# Patient Record
Sex: Female | Born: 1950 | Race: White | Hispanic: No | Marital: Married | Smoking: Never smoker
Health system: Southern US, Community
[De-identification: ages and names within clinical notes are randomized; demographics above are authoritative.]

## PROBLEM LIST (undated history)

## (undated) DIAGNOSIS — C4490 Unspecified malignant neoplasm of skin, unspecified: Secondary | ICD-10-CM

## (undated) DIAGNOSIS — I10 Essential (primary) hypertension: Secondary | ICD-10-CM

## (undated) DIAGNOSIS — E785 Hyperlipidemia, unspecified: Secondary | ICD-10-CM

## (undated) HISTORY — PX: LYMPH NODE DISSECTION: SHX5087

## (undated) HISTORY — PX: SKIN CANCER EXCISION: SHX779

## (undated) HISTORY — DX: Essential (primary) hypertension: I10

---

## 2004-07-01 ENCOUNTER — Ambulatory Visit (HOSPITAL_COMMUNITY): Admission: RE | Admit: 2004-07-01 | Discharge: 2004-07-01 | Payer: Self-pay | Admitting: Gynecology

## 2005-12-22 ENCOUNTER — Ambulatory Visit: Payer: Self-pay | Admitting: Internal Medicine

## 2006-01-07 ENCOUNTER — Ambulatory Visit: Payer: Self-pay | Admitting: Gastroenterology

## 2007-01-19 ENCOUNTER — Ambulatory Visit: Payer: Self-pay | Admitting: Internal Medicine

## 2008-04-05 ENCOUNTER — Ambulatory Visit: Payer: Self-pay | Admitting: Internal Medicine

## 2008-04-16 ENCOUNTER — Ambulatory Visit: Payer: Self-pay | Admitting: Internal Medicine

## 2008-10-17 ENCOUNTER — Ambulatory Visit: Payer: Self-pay | Admitting: Internal Medicine

## 2009-10-22 ENCOUNTER — Ambulatory Visit: Payer: Self-pay | Admitting: Internal Medicine

## 2009-10-30 ENCOUNTER — Ambulatory Visit: Payer: Self-pay | Admitting: Gastroenterology

## 2011-01-02 ENCOUNTER — Ambulatory Visit: Payer: Self-pay | Admitting: Internal Medicine

## 2011-08-23 ENCOUNTER — Ambulatory Visit: Payer: Self-pay | Admitting: General Practice

## 2011-10-06 ENCOUNTER — Encounter: Payer: Self-pay | Admitting: Orthopedic Surgery

## 2011-10-08 ENCOUNTER — Encounter: Payer: Self-pay | Admitting: Orthopedic Surgery

## 2011-11-07 ENCOUNTER — Encounter: Payer: Self-pay | Admitting: Orthopedic Surgery

## 2011-12-11 ENCOUNTER — Ambulatory Visit: Payer: Self-pay | Admitting: Internal Medicine

## 2012-01-22 ENCOUNTER — Ambulatory Visit: Payer: Self-pay | Admitting: Internal Medicine

## 2013-02-17 ENCOUNTER — Ambulatory Visit: Payer: Self-pay | Admitting: Internal Medicine

## 2014-02-28 ENCOUNTER — Ambulatory Visit (INDEPENDENT_AMBULATORY_CARE_PROVIDER_SITE_OTHER): Payer: BC Managed Care – PPO | Admitting: Podiatry

## 2014-02-28 ENCOUNTER — Ambulatory Visit (INDEPENDENT_AMBULATORY_CARE_PROVIDER_SITE_OTHER): Payer: BC Managed Care – PPO

## 2014-02-28 ENCOUNTER — Encounter: Payer: Self-pay | Admitting: Podiatry

## 2014-02-28 VITALS — BP 159/88 | HR 67 | Resp 16 | Ht 66.0 in | Wt 192.0 lb

## 2014-02-28 DIAGNOSIS — M79609 Pain in unspecified limb: Secondary | ICD-10-CM

## 2014-02-28 DIAGNOSIS — L600 Ingrowing nail: Secondary | ICD-10-CM

## 2014-02-28 DIAGNOSIS — M79672 Pain in left foot: Principal | ICD-10-CM

## 2014-02-28 DIAGNOSIS — M79671 Pain in right foot: Secondary | ICD-10-CM

## 2014-02-28 DIAGNOSIS — M722 Plantar fascial fibromatosis: Secondary | ICD-10-CM

## 2014-02-28 MED ORDER — NEOMYCIN-POLYMYXIN-HC 3.5-10000-1 OT SOLN: OTIC | Status: DC

## 2014-02-28 NOTE — Patient Instructions (Signed)

## 2014-02-28 NOTE — Progress Notes (Signed)
   Subjective:    Patient ID: Cassandra Miller, female    DOB: 1951-05-10, 63 y.o.   MRN: [id number]  HPI Comments: i have ingrown toenails on both big toenails, the left one is giving me the most trouble. Also i have calluses on the bottom of both feet .       Review of Systems  All other systems reviewed and are negative.       Objective:   Physical Exam: I have reviewed her past medical history medications allergies surgeries and social history. Vital signs are stable she is alert and oriented x3. Pulses are strongly palpable bilateral. Neurologic sensorium is intact. Deep tendon reflexes are intact bilateral. Muscle strength is 5 over 5 dorsiflexors plantar flexors inverters everters all intrinsic musculature is intact orthopedic evaluation demonstrates all joints distal to the ankle a full range of motion without crepitation. She does have some pain on palpation medial calcaneal tubercle of the left heel. Radiographic evaluation does demonstrate a soft tissue increase in density at the plantar fascial calcaneal insertion site of the left heel. Cutaneous evaluation demonstrates supple well hydrated cutis with sharp incurvated nail margins along the tibial and fibular border of the hallux left there is mild paronychia associated with this. She also has reactive hyperkeratosis to the plantar aspect of the forefoot bilateral.        Assessment & Plan:  Assessment: Ingrown toenail hallux left. Plantar flexed metatarsals #2 and #3 bilateral. Plantar fasciitis left foot.  Plan: Discussed etiology pathology conservative versus surgical therapies at this point we performed a chemical matrixectomy to the tibial and fibular border of the hallux left. And I debrided all reactive hyperkeratosis. She tolerated these procedures well and I will followup with her in one week for evaluation of the toenail and possible treatment of the plantar fasciitis.

## 2014-03-02 ENCOUNTER — Ambulatory Visit: Payer: Self-pay | Admitting: Internal Medicine

## 2014-03-08 ENCOUNTER — Ambulatory Visit (INDEPENDENT_AMBULATORY_CARE_PROVIDER_SITE_OTHER): Payer: BC Managed Care – PPO | Admitting: Podiatry

## 2014-03-08 ENCOUNTER — Encounter: Payer: Self-pay | Admitting: Podiatry

## 2014-03-08 DIAGNOSIS — L600 Ingrowing nail: Secondary | ICD-10-CM

## 2014-03-08 NOTE — Progress Notes (Signed)
She presents today for followup of her matrixectomy hallux tibial and fibular border of the left foot. She's been soaking in Betadine warm water and apply Cortisporin otic as directed. She says not very painful at all. She also states that she has not had any flareups of her plantar fasciitis.  Objective: Vital signs are stable she is alert and oriented x3. Pulses are palpable. There is no erythema edema cellulitis drainage or odor to the hallux left.  Assessment: Well-healing surgical hallux left.  Plan: Discontinue Betadine start with Epsom salts warm water soaks covered in the day and leave open at night. Followup with me should she start to develop any worsening signs of infection or notify me of plantar fasciitis type symptoms.

## 2014-11-05 ENCOUNTER — Ambulatory Visit (INDEPENDENT_AMBULATORY_CARE_PROVIDER_SITE_OTHER): Payer: BC Managed Care – PPO | Admitting: Podiatry

## 2014-11-05 VITALS — BP 126/71 | HR 67 | Resp 16

## 2014-11-05 DIAGNOSIS — M722 Plantar fascial fibromatosis: Secondary | ICD-10-CM

## 2014-11-05 NOTE — Progress Notes (Signed)
She presents today concerned about her hallux nail plate right in her fourth digit of her right foot and is discoloration around the nail plate. He also complains of left heel pain.  Objective: Vital signs are stable she is alert and oriented 3. Subungual contusions to the hallux into the fourth digital nail plate right. She has pain on palpation medial calcaneal tubercle of the left heel.  Assessment: Plantar fasciitis left. Contusion right nail plates.  Plan: Injected left heel today with Kenalog and local anesthetic. Also dispensed a night splint. We discussed appropriate shoe gear stretching exercises ice therapy and shoe gear modifications.

## 2014-12-10 ENCOUNTER — Ambulatory Visit: Payer: BC Managed Care – PPO | Admitting: Podiatry

## 2015-03-07 ENCOUNTER — Ambulatory Visit
Admit: 2015-03-07 | Disposition: A | Discharge status: Home / Self Care | Payer: Self-pay | Attending: Internal Medicine | Admitting: Internal Medicine

## 2015-12-15 ENCOUNTER — Emergency Department
Admission: EM | Admit: 2015-12-15 | Discharge: 2015-12-15 | Disposition: A | Discharge status: Home / Self Care | Payer: BLUE CROSS/BLUE SHIELD | Attending: Emergency Medicine | Admitting: Emergency Medicine

## 2015-12-15 ENCOUNTER — Encounter: Payer: Self-pay | Admitting: Emergency Medicine

## 2015-12-15 ENCOUNTER — Emergency Department: Payer: BLUE CROSS/BLUE SHIELD

## 2015-12-15 DIAGNOSIS — Y9289 Other specified places as the place of occurrence of the external cause: Secondary | ICD-10-CM | POA: Diagnosis not present

## 2015-12-15 DIAGNOSIS — Z23 Encounter for immunization: Secondary | ICD-10-CM | POA: Diagnosis not present

## 2015-12-15 DIAGNOSIS — Z7982 Long term (current) use of aspirin: Secondary | ICD-10-CM | POA: Diagnosis not present

## 2015-12-15 DIAGNOSIS — S81011A Laceration without foreign body, right knee, initial encounter: Secondary | ICD-10-CM | POA: Diagnosis not present

## 2015-12-15 DIAGNOSIS — S59902A Unspecified injury of left elbow, initial encounter: Secondary | ICD-10-CM | POA: Insufficient documentation

## 2015-12-15 DIAGNOSIS — Y998 Other external cause status: Secondary | ICD-10-CM | POA: Diagnosis not present

## 2015-12-15 DIAGNOSIS — I10 Essential (primary) hypertension: Secondary | ICD-10-CM | POA: Diagnosis not present

## 2015-12-15 DIAGNOSIS — S4992XA Unspecified injury of left shoulder and upper arm, initial encounter: Secondary | ICD-10-CM | POA: Diagnosis present

## 2015-12-15 DIAGNOSIS — Y9389 Activity, other specified: Secondary | ICD-10-CM | POA: Diagnosis not present

## 2015-12-15 DIAGNOSIS — Z79899 Other long term (current) drug therapy: Secondary | ICD-10-CM | POA: Insufficient documentation

## 2015-12-15 DIAGNOSIS — S42292A Other displaced fracture of upper end of left humerus, initial encounter for closed fracture: Secondary | ICD-10-CM | POA: Diagnosis not present

## 2015-12-15 DIAGNOSIS — W000XXA Fall on same level due to ice and snow, initial encounter: Secondary | ICD-10-CM | POA: Insufficient documentation

## 2015-12-15 MED ORDER — HYDROMORPHONE HCL 1 MG/ML IJ SOLN
1.0000 mg | Freq: Once | INTRAMUSCULAR | Status: AC
  Administered 2015-12-15: 1 mg via INTRAMUSCULAR
  Filled 2015-12-15: qty 1

## 2015-12-15 MED ORDER — TETANUS-DIPHTH-ACELL PERTUSSIS 5-2.5-18.5 LF-MCG/0.5 IM SUSP
0.5000 mL | Freq: Once | INTRAMUSCULAR | Status: AC
  Administered 2015-12-15: 0.5 mL via INTRAMUSCULAR
  Filled 2015-12-15: qty 0.5

## 2015-12-15 MED ORDER — LIDOCAINE-EPINEPHRINE (PF) 1 %-1:200000 IJ SOLN
INTRAMUSCULAR | Status: AC
  Filled 2015-12-15: qty 30

## 2015-12-15 MED ORDER — OXYCODONE-ACETAMINOPHEN 5-325 MG PO TABS: 1.0000 | ORAL_TABLET | ORAL | Status: DC | PRN

## 2015-12-15 MED ORDER — PROMETHAZINE HCL 25 MG PO TABS: 25.0000 mg | ORAL_TABLET | Freq: Four times a day (QID) | ORAL | Status: DC | PRN

## 2015-12-15 NOTE — Discharge Instructions (Signed)
Laceration Care, Adult A laceration is a cut that goes through all of the layers of the skin and into the tissue that is right under the skin. Some lacerations heal on their own. Others need to be closed with stitches (sutures), staples, skin adhesive strips, or skin glue. Proper laceration care minimizes the risk of infection and helps the laceration to heal better. HOW TO CARE FOR YOUR LACERATION If sutures or staples were used:  Keep the wound clean and dry.  If you were given a bandage (dressing), you should change it at least one time per day or as told by your health care provider. You should also change it if it becomes wet or dirty.  Keep the wound completely dry for the first 24 hours or as told by your health care provider. After that time, you may shower or bathe. However, make sure that the wound is not soaked in water until after the sutures or staples have been removed.  Clean the wound one time each day or as told by your health care provider:  Wash the wound with soap and water.  Rinse the wound with water to remove all soap.  Pat the wound dry with a clean towel. Do not rub the wound.  After cleaning the wound, apply a thin layer of antibiotic ointmentas told by your health care provider. This will help to prevent infection and keep the dressing from sticking to the wound.  Have the sutures or staples removed as told by your health care provider. If skin adhesive strips were used:  Keep the wound clean and dry.  If you were given a bandage (dressing), you should change it at least one time per day or as told by your health care provider. You should also change it if it becomes dirty or wet.  Do not get the skin adhesive strips wet. You may shower or bathe, but be careful to keep the wound dry.  If the wound gets wet, pat it dry with a clean towel. Do not rub the wound.  Skin adhesive strips fall off on their own. You may trim the strips as the wound heals. Do not  remove skin adhesive strips that are still stuck to the wound. They will fall off in time. If skin glue was used:  Try to keep the wound dry, but you may briefly wet it in the shower or bath. Do not soak the wound in water, such as by swimming.  After you have showered or bathed, gently pat the wound dry with a clean towel. Do not rub the wound.  Do not do any activities that will make you sweat heavily until the skin glue has fallen off on its own.  Do not apply liquid, cream, or ointment medicine to the wound while the skin glue is in place. Using those may loosen the film before the wound has healed.  If you were given a bandage (dressing), you should change it at least one time per day or as told by your health care provider. You should also change it if it becomes dirty or wet.  If a dressing is placed over the wound, be careful not to apply tape directly over the skin glue. Doing that may cause the glue to be pulled off before the wound has healed.  Do not pick at the glue. The skin glue usually remains in place for 5-10 days, then it falls off of the skin. General Instructions  Take over-the-counter and prescription  medicines only as told by your health care provider.  If you were prescribed an antibiotic medicine or ointment, take or apply it as told by your doctor. Do not stop using it even if your condition improves.  To help prevent scarring, make sure to cover your wound with sunscreen whenever you are outside after stitches are removed, after adhesive strips are removed, or when glue remains in place and the wound is healed. Make sure to wear a sunscreen of at least 30 SPF.  Do not scratch or pick at the wound.  Keep all follow-up visits as told by your health care provider. This is important.  Check your wound every day for signs of infection. Watch for:  Redness, swelling, or pain.  Fluid, blood, or pus.  Raise (elevate) the injured area above the level of your heart  while you are sitting or lying down, if possible. SEEK MEDICAL CARE IF:  You received a tetanus shot and you have swelling, severe pain, redness, or bleeding at the injection site.  You have a fever.  A wound that was closed breaks open.  You notice a bad smell coming from your wound or your dressing.  You notice something coming out of the wound, such as wood or glass.  Your pain is not controlled with medicine.  You have increased redness, swelling, or pain at the site of your wound.  You have fluid, blood, or pus coming from your wound.  You notice a change in the color of your skin near your wound.  You need to change the dressing frequently due to fluid, blood, or pus draining from the wound.  You develop a new rash.  You develop numbness around the wound. SEEK IMMEDIATE MEDICAL CARE IF:  You develop severe swelling around the wound.  Your pain suddenly increases and is severe.  You develop painful lumps near the wound or on skin that is anywhere on your body.  You have a red streak going away from your wound.  The wound is on your hand or foot and you cannot properly move a finger or toe.  The wound is on your hand or foot and you notice that your fingers or toes look pale or bluish.   This information is not intended to replace advice given to you by your health care provider. Make sure you discuss any questions you have with your health care provider.   Document Released: 11/23/2005 Document Revised: 04/09/2015 Document Reviewed: 11/19/2014 Elsevier Interactive Patient Education 2016 [employer]  Humerus Fracture Treated With Immobilization The humerus is the large bone in your upper arm. You have a broken (fractured) humerus. These fractures are easily diagnosed with X-rays. TREATMENT  Simple fractures which will heal without disability are treated with simple immobilization. Immobilization means you will wear a cast, splint, or sling. You have a fracture  which will do well with immobilization. The fracture will heal well simply by being held in a good position until it is stable enough to begin range of motion exercises. Do not take part in activities which would further injure your arm.  HOME CARE INSTRUCTIONS   Put ice on the injured area.  Put ice in a plastic bag.  Place a towel between your skin and the bag.  Leave the ice on for 15-20 minutes, 03-04 times a day.  If you have a cast:  Do not scratch the skin under the cast using sharp or pointed objects.  Check the skin around the cast  every day. You may put lotion on any red or sore areas.  Keep your cast dry and clean.  If you have a splint:  Wear the splint as directed.  Keep your splint dry and clean.  You may loosen the elastic around the splint if your fingers become numb, tingle, or turn cold or blue.  If you have a sling:  Wear the sling as directed.  Do not put pressure on any part of your cast or splint until it is fully hardened.  Your cast or splint can be protected during bathing with a plastic bag. Do not lower the cast or splint into water.  Only take over-the-counter or prescription medicines for pain, discomfort, or fever as directed by your caregiver.  Do range of motion exercises as instructed by your caregiver.  Follow up as directed by your caregiver. This is very important in order to avoid permanent injury or disability and chronic pain. SEEK IMMEDIATE MEDICAL CARE IF:   Your skin or nails in the injured arm turn blue or gray.  Your arm feels cold or numb.  You develop severe pain in the injured arm.  You are having problems with the medicines you were given. MAKE SURE YOU:   Understand these instructions.  Will watch your condition.  Will get help right away if you are not doing well or get worse.   This information is not intended to replace advice given to you by your health care provider. Make sure you discuss any questions you  have with your health care provider.   Document Released: 03/01/2001 Document Revised: 12/14/2014 Document Reviewed: 04/17/2015 Elsevier Interactive Patient Education [employer].

## 2015-12-15 NOTE — ED Notes (Signed)
Reports slipping on ice, pain to left shoulder.  +pms to distal hand

## 2015-12-15 NOTE — ED Provider Notes (Signed)
[facility] Emergency Department Provider Note  ____________________________________________  Time seen: Approximately 11:08 AM  I have reviewed the triage vital signs and the nursing notes.   HISTORY  Chief Complaint Shoulder Injury    HPI Cassandra Miller is a 65 y.o. female who presents for further emergency evaluation of slipping on the ice and landing on her left shoulder. Patient complains of shoulder pain, left humeral pain, left elbow pain.In addition, patient complains of having a laceration to her right knee.   Past Medical History  Diagnosis Date  . Hypertension     There are no active problems to display for this patient.   Past Surgical History  Procedure Laterality Date  . Cesarean section      Current Outpatient Rx  Name  Route  Sig  Dispense  Refill  . amLODipine-benazepril (LOTREL) 5-20 MG per capsule               . aspirin 81 MG tablet   Oral   Take 81 mg by mouth daily.         . Calcium Carb-Cholecalciferol (CALCIUM + D3) 600-200 MG-UNIT TABS   Oral   Take by mouth.         . Cholecalciferol (VITAMIN D-3) 1000 UNITS CAPS   Oral   Take by mouth.         . cyanocobalamin 1000 MCG tablet   Oral   Take 100 mcg by mouth daily.         . furosemide (LASIX) 20 MG tablet               . Omega-3 Fatty Acids (FISH OIL) 1000 MG CPDR   Oral   Take by mouth.         . oxyCODONE-acetaminophen (ROXICET) 5-325 MG tablet   Oral   Take 1-2 tablets by mouth every 4 (four) hours as needed for severe pain.   15 tablet   0   . promethazine (PHENERGAN) 25 MG tablet   Oral   Take 1 tablet (25 mg total) by mouth every 6 (six) hours as needed for nausea or vomiting.   12 tablet   0   . simvastatin (ZOCOR) 20 MG tablet                 Allergies Review of patient's allergies indicates no known allergies.  History reviewed. No pertinent family history.  Social History Social History  Substance Use  Topics  . Smoking status: Never Smoker   . Smokeless tobacco: Never Used  . Alcohol Use: Yes     Comment: social    Review of Systems Constitutional: No fever/chills Eyes: No visual changes. ENT: No sore throat. Cardiovascular: Denies chest pain. Respiratory: Denies shortness of breath. Gastrointestinal: No abdominal pain.  No nausea, no vomiting.  No diarrhea.  No constipation. Genitourinary: Negative for dysuria. Musculoskeletal: Positive for left shoulder pain. As for laceration to right knee. Skin: Negative for rash. Neurological: Negative for headaches, focal weakness or numbness.  10-point ROS otherwise negative.  ____________________________________________   PHYSICAL EXAM: BP 138/71 mmHg  Pulse 60  Temp(Src) 97.7 F (36.5 C) (Oral)  Resp 18  Ht 5\' 5"  (1.651 m)  Wt 89.359 kg  BMI 32.78 kg/m2  SpO2 97%  VITAL SIGNS: ED Triage Vitals  Enc Vitals Group     BP --      Pulse --      Resp --      Temp --  Temp src --      SpO2 --      Weight --      Height --      Head Cir --      Peak Flow --      Pain Score 12/15/15 1046 7     Pain Loc --      Pain Edu? --      Excl. in [place]? --     Constitutional: Alert and oriented. Well appearing and in no acute distress. Cardiovascular: Normal rate, regular rhythm. Grossly normal heart sounds.  Good peripheral circulation. Respiratory: Normal respiratory effort.  No retractions. Lungs CTAB. Musculoskeletal: Left shoulder limited range of motion in guarded against body. Distally neurovascularly intact. Unable to pronate and supinate. Neurologic:  Normal speech and language. No gross focal neurologic deficits are appreciated. No gait instability. Skin:  Right knee with full range of motion however 1 cm laceration noted to the anterior aspect of the knee. Psychiatric: Mood and affect are normal. Speech and behavior are normal.  ____________________________________________   LABS (all labs ordered are listed, but  only abnormal results are displayed)  Labs Reviewed - No data to display   RADIOLOGY   Comminuted fracture through the proximal humeral diaphysis. Additionally the fractures extend into humeral head. The scapula appears unremarkable. Visualized left hemi thorax is unremarkable.  IMPRESSION: Comminuted proximal left humerus fracture involving the proximal diaphysis and extending into the humeral head.   PROCEDURES  Procedure(s) performed: YES LACERATION REPAIR Performed by: Arlyss Repress Authorized by: Arlyss Repress Consent: Verbal consent obtained. Risks and benefits: risks, benefits and alternatives were discussed Consent given by: patient Patient identity confirmed: provided demographic data Prepped and Draped in normal sterile fashion Wound explored  Laceration Location: right knee  Laceration Length: 1cm  No Foreign Bodies seen or palpated  Anesthesia: local infiltration  Local anesthetic: lidocaine 1% with epinephrine  Anesthetic total: 2 ml  Irrigation method: syringe Amount of cleaning: standard  Skin closure: 4-0 Vicryl   Number of sutures: 2  Technique: simple interrupted  Patient tolerance: Patient tolerated the procedure well with no immediate complications.  Critical Care performed: No  ____________________________________________   INITIAL IMPRESSION / ASSESSMENT AND PLAN / ED COURSE  Pertinent labs & imaging results that were available during my care of the patient were reviewed by me and considered in my medical decision making (see chart for details).  Acute left humeral head comminuted fracture extending through the diaphysis. Discussed all clinical findings with orthopedics on call, Dr. Rudene Christians. She to follow-up with orthopedics this week. Patient also sustained a 1 cm laceration to the right knee which was closed by myself. Dressing applied. ____________________________________________   FINAL CLINICAL IMPRESSION(S) / ED  DIAGNOSES  Final diagnoses:  Humeral head fracture, left, closed, initial encounter  Knee laceration, right, initial encounter      Arlyss Repress, PA-C 12/15/15 1337  Eula Listen, MD 12/15/15 [phone]

## 2016-01-22 ENCOUNTER — Ambulatory Visit: Payer: BLUE CROSS/BLUE SHIELD | Attending: Student

## 2016-01-22 DIAGNOSIS — R531 Weakness: Secondary | ICD-10-CM

## 2016-01-22 DIAGNOSIS — X58XXXD Exposure to other specified factors, subsequent encounter: Secondary | ICD-10-CM | POA: Insufficient documentation

## 2016-01-22 DIAGNOSIS — S42295D Other nondisplaced fracture of upper end of left humerus, subsequent encounter for fracture with routine healing: Secondary | ICD-10-CM

## 2016-01-22 NOTE — Therapy (Signed)
[place] [facility] [address], [place], [address] Phone: [phone]   Fax:  [phone]  Physical Therapy Evaluation  Patient Details  Name: Cassandra Miller MRN: [id number] Date of Birth: Jul 05, 1951 Referring Provider: Damaris Hippo, PA  Encounter Date: 01/22/2016      PT End of Session - 01/22/16 0814    Visit Number 1   Number of Visits 9   Date for PT Re-Evaluation 02/20/16   Authorization Type 1   Authorization Time Period of 10   PT Start Time 0816   PT Stop Time 0914   PT Time Calculation (min) 58 min   Activity Tolerance Patient tolerated treatment well   Behavior During Therapy [facility] for tasks assessed/performed      Past Medical History  Diagnosis Date  . Hypertension     Past Surgical History  Procedure Laterality Date  . Cesarean section      There were no vitals filed for this visit.  Visit Diagnosis:  Other closed nondisplaced fracture of proximal end of left humerus with routine healing, subsequent encounter - Plan: PT plan of care cert/re-cert  Weakness - Plan: PT plan of care cert/re-cert      Subjective Assessment - 01/22/16 0831    Subjective L shoulder/arm pain: 0/10 current (L arm in sling). 3/10 at worst. Pt also adds that she has a bruise anterior L arm around elbow area.    Pertinent History L proximal humeral fracture on 12/15/2015 secondary a fall from slipping on ice while walking [place] parking lot [place] [employer].  Wore an immobilizer from 12/15/15 to 01/15/16 until she went to her orthopedic doctor. Currently using a sling. MD stated that her arm is starting to calcify and her next MD appointment is 02/12/16 (at 9:15 am). Per pt, MD told her to keep her L arm in a sling, and not to drive.   Pt also states that her MD said she can type  with her L hand.    Patient Stated Goals Pt expresses desire to be able to lift her L arm straight up. Hold her 65 month old grandson.    Currently in Pain?  No/denies   Pain Score 0-No pain   Pain Location Shoulder   Pain Orientation Left   Aggravating Factors  Difficulty raising her L arm, bathing, dressing, fixing her hair   Multiple Pain Sites No            [facility] PT Assessment - 01/22/16 0840    Assessment   Medical Diagnosis L proximal humerus fracture   Referring Provider Damaris Hippo, PA   Onset Date/Surgical Date 12/15/15   Hand Dominance Right   Next MD Visit 02/12/16   Prior Therapy No known physical therapy for current condition   Precautions   Precaution Comments PROM    Restrictions   Other Position/Activity Restrictions No known weight bearing restrictions from MD office   Balance Screen   Has the patient fallen in the past 6 months Yes   How many times? 1  pt slipped on ice and injured her L shoulder   Has the patient had a decrease in activity level because of a fear of falling?  No  pt does mention being nervous about falling since injury   Is the patient reluctant to leave their home because of a fear of falling?  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    AROM   Overall AROM Comments L wrist: WFL, no pain.  Slight decreased ROM with supination/pronation   PROM   Overall PROM Comments L shoulder PROM not assessed at this time to allow for more healing of fracture  Pt currently 5 weeks since injury   Left Elbow Flexion 139   Left Elbow Extension -19   Palpation   Palpation comment Muslce tension L biceps, bilateral rhomboid muscles. Decreased mobility L first rib. Slight tenderness L anterior shoulder          Objectives  There-ex  Sitting with L forearm supported by pillow: Directed patient  with seated L elbow flexion 10x3 using R UE Seated L forearm supination/pronation 10x3 using R UE   Gave aforementioned exercises as part of her HEP (10x3 for 3 times a day). Pt demonstrated and verbalized understanding  Standing L forearm flexion 10x using R UE   R cervical side bend stretch (L arm ache, decreased with standing L forearm flexion/ extension using R UE)  Improved exercise technique, movement at target joints, use of target muscles after mod verbal, visual, tactile cues.               PT Education - 01/22/16 1952    Education provided Yes   Education Details Ther-ex, HEP, plan of care   Person(s) Educated Patient;Spouse   Methods Explanation;Demonstration;Tactile cues;Verbal cues   Comprehension Verbalized understanding;Returned demonstration             PT Long Term Goals - 01/22/16 1935    PT LONG TERM GOAL #1   Title Patient will have at least 100 degrees L shoulder flexion and scaption PROM to promote ability to raise her L arm when appropriate.    Baseline PROM not measured yet to allow for more healing of fracture.    Time 4   Period Weeks   Status New   PT LONG TERM GOAL #2   Title Patient will improve her Quick Dash Disability/Symptom score by at least 10 % as a demonstration of improved function.    Baseline 46%   Time 4   Period Weeks   Status New   PT LONG TERM GOAL #3   Title When appropriate, patient will have at least 100 degrees L shoulder flexion and scaption AAROM against gravity to promote ability to raise her L arm.   Baseline Not assessed at this time to allow for more healing of fracture.    Time 4   Period Weeks   Status New               Plan - 01/22/16 1849    Clinical Impression Statement Patient is a 65 year old female who came to physical therapy secondary to L proximal humerus fracture on 12/15/15 due to a fall. Pt also presents with difficulty raising her L arm (L shoulder ROM not measured yet to allow for more  healing), limited L elbow extension, poor posture, tenderness L anterior shoulder, muscle tension L biceps, bilateral rhomboid muscles; decreased L first rib mobility; L UE weakness, and difficulty performing functional tasks such as donning and doffing clothes, and fixing her hair. Patient will benefit from skilled physical therapy intervention to address the aforementioned deficits.  Pt will benefit from skilled therapeutic intervention in order to improve on the following deficits Decreased range of motion;Postural dysfunction;Decreased strength;Pain   Rehab Potential Good   Clinical Impairments Affecting Rehab Potential healing time   PT Frequency 2x / week   PT Duration 4 weeks   PT Treatment/Interventions Therapeutic exercise;Therapeutic activities;Manual techniques;Electrical Stimulation;Neuromuscular re-education;Patient/family education;Ultrasound   PT Next Visit Plan soft tissue mobilization, gentle ROM, scapular movement, posture   Consulted and Agree with Plan of Care Patient;Family member/caregiver   Family Member Consulted husband present          G-Codes - 2016/02/12 1943    Functional Assessment Tool Used Quick Dash Disability/Symptom Score, patient interview, clinical presentation, clinical judgement   Functional Limitation Carrying, moving and handling objects   Carrying, Moving and Handling Objects Current Status [phone]) At least 60 percent but less than 80 percent impaired, limited or restricted   Carrying, Moving and Handling Objects Goal Status [id number]) At least 20 percent but less than 40 percent impaired, limited or restricted       Problem List There are no active problems to display for this patient.  Thank you for your referral.  Joneen Boers PT, DPT   02/12/16, 7:53 PM  [place] [facility] [address] [place], [address] Phone: [phone]   Fax:  [phone]  Name: Cassandra Miller MRN:  [id number] Date of Birth: 11-26-1951

## 2016-01-22 NOTE — Patient Instructions (Addendum)
Sitting with forearm supported by pillow:    seated  L elbow flexion 10x3 using R UE   seated L forearm supination/pronation 10x3 using R UE  Gave aforementioned exercise 10x3 for 3x/day as part of her HEP. Pt demonstrated and verbalized understanding.

## 2016-01-23 ENCOUNTER — Ambulatory Visit: Payer: Medicare Other | Admitting: Physical Therapy

## 2016-01-27 ENCOUNTER — Ambulatory Visit: Payer: BLUE CROSS/BLUE SHIELD

## 2016-01-27 DIAGNOSIS — S42295D Other nondisplaced fracture of upper end of left humerus, subsequent encounter for fracture with routine healing: Secondary | ICD-10-CM | POA: Diagnosis not present

## 2016-01-27 DIAGNOSIS — R531 Weakness: Secondary | ICD-10-CM

## 2016-01-27 NOTE — Therapy (Signed)
[place] [facility] [address] [place], [address] Phone: [phone]   Fax:  [phone]  Physical Therapy Treatment  Patient Details  Name: Cassandra Miller MRN: [id number] Date of Birth: 12-29-1950 Referring Provider: Damaris Hippo, PA  Encounter Date: 01/27/2016      PT End of Session - 01/27/16 0802    Visit Number 2   Number of Visits 9   Date for PT Re-Evaluation 02/20/16   Authorization Type 2   Authorization Time Period of 10   PT Start Time 0802   PT Stop Time 0849   PT Time Calculation (min) 47 min   Activity Tolerance Patient tolerated treatment well   Behavior During Therapy [facility] for tasks assessed/performed      Past Medical History  Diagnosis Date  . Hypertension     Past Surgical History  Procedure Laterality Date  . Cesarean section      There were no vitals filed for this visit.  Visit Diagnosis:  Other closed nondisplaced fracture of proximal end of left humerus with routine healing, subsequent encounter  Weakness      Subjective Assessment - 01/27/16 0802    Subjective Pt states that her L shoulder is doing ok. No pain.    Pertinent History L proximal humeral fracture on 12/15/2015 secondary a fall from slipping on ice while walking [place] [employer].  Wore an immobilizer from 12/15/15 to 01/15/16 until she went to her orthopedic doctor. Currently using a sling. MD stated that her arm is starting to calcify and her next MD appointment is 02/12/16 (at 9:15 am). Per pt, MD told her to keep her L arm in a sling, and not to drive.   Pt also states that her MD said she can type  with her L hand.    Patient Stated Goals Pt expresses desire to be able to lift her L arm straight up. Hold her 53 month old grandson.    Currently in Pain? No/denies   Pain Score 0-No pain   Multiple Pain Sites No    Supine L shoulder flexion PROM 90 degrees, L shoulder scaption in supine PROM 72  degrees   Objectives  There-ex  Directed patient with seated bilateral scapular retraction with sling on 10x3 with 5 second holds   Pt education of L shoulder anatomy  Supine L shoulder flexion PROM using R UE 10x3  Supine L shoulder scaption PROM 10x3 (by PT)  Standing L elbow flexion using R UE 10x3 (to promote flexion/extension ROM)  Improved exercise technique, movement at target joints, use of target muscles after mod verbal, visual, tactile cues.        Manual therapy:   Soft tissue mobilization to L pectoralis major distal insertion in supine  Soft tissue mobilization to L scalene muscles   Soft tissue mobilization to L rhomboid minor            [facility] PT Assessment - 01/27/16 0001    PROM   Left Shoulder Flexion 90 Degrees  in supine   Left Shoulder ABduction 72 Degrees  scaption in supine                      PT Education - 01/27/16 0805    Education provided No   Education Details ther-ex   [employer]) Educated Patient   Methods Explanation;Demonstration;Tactile cues;Verbal cues   Comprehension Verbalized understanding;Returned demonstration  PT Long Term Goals - 01/22/16 1935    PT LONG TERM GOAL #1   Title Patient will have at least 100 degrees L shoulder flexion and scaption PROM to promote ability to raise her L arm when appropriate.    Baseline PROM not measured yet to allow for more healing of fracture.    Time 4   Period Weeks   Status New   PT LONG TERM GOAL #2   Title Patient will improve her Quick Dash Disability/Symptom score by at least 10 % as a demonstration of improved function.    Baseline 46%   Time 4   Period Weeks   Status New   PT LONG TERM GOAL #3   Title When appropriate, patient will have at least 100 degrees L shoulder flexion and scaption AAROM against gravity to promote ability to raise her L arm.   Baseline Not assessed at this time to allow for more healing of fracture.    Time 4    Period Weeks   Status New               Plan - 01/27/16 0801    Clinical Impression Statement Tolerated session well without complain of L shoulder or arm pain. Demonstrates limited L shoulder flexion and scaption ROM.    Pt will benefit from skilled therapeutic intervention in order to improve on the following deficits Decreased range of motion;Postural dysfunction;Decreased strength;Pain   Rehab Potential Good   Clinical Impairments Affecting Rehab Potential healing time   PT Frequency 2x / week   PT Duration 4 weeks   PT Treatment/Interventions Therapeutic exercise;Therapeutic activities;Manual techniques;Electrical Stimulation;Neuromuscular re-education;Patient/family education;Ultrasound   PT Next Visit Plan soft tissue mobilization, gentle ROM, scapular movement, posture   Consulted and Agree with Plan of Care Patient;Family member/caregiver   Family Member Consulted husband present        Problem List There are no active problems to display for this patient.  Joneen Boers PT, DPT   01/27/2016, 9:00 AM  [place] [facility] [address], [place], [address] Phone: [phone]   Fax:  [phone]  Name: Destiny Gasaway Teaster MRN: [id number] Date of Birth: [birth year]

## 2016-01-29 ENCOUNTER — Ambulatory Visit: Payer: BLUE CROSS/BLUE SHIELD

## 2016-01-29 DIAGNOSIS — S42295D Other nondisplaced fracture of upper end of left humerus, subsequent encounter for fracture with routine healing: Secondary | ICD-10-CM | POA: Diagnosis not present

## 2016-01-29 DIAGNOSIS — R531 Weakness: Secondary | ICD-10-CM

## 2016-01-29 NOTE — Patient Instructions (Signed)
Gave supine PROM L shoulder flexion using R UE 10x3 pain/discomfort free range daily. Pt verbalized understanding.   And soft tissue mobilization to L or R rhomboids (husband demonstrated and verbalized understanding)   As part of her HEP.

## 2016-01-29 NOTE — Therapy (Addendum)
[place] [facility] [address], [place], [address] Phone: [phone]   Fax:  [phone]  Physical Therapy Treatment  Patient Details  Name: Cassandra Miller MRN: [id number] Date of Birth: [birth year] Referring Provider: Damaris Hippo, PA  Encounter Date: 01/29/2016      PT End of Session - 01/29/16 0802    Visit Number 3   Number of Visits 9   Date for PT Re-Evaluation 02/20/16   Authorization Type 3   Authorization Time Period of 10   PT Start Time 0802   PT Stop Time 0855   PT Time Calculation (min) 53 min   Activity Tolerance Patient tolerated treatment well   Behavior During Therapy [facility] for tasks assessed/performed      Past Medical History  Diagnosis Date  . Hypertension     Past Surgical History  Procedure Laterality Date  . Cesarean section      There were no vitals filed for this visit.  Visit Diagnosis:  Other closed nondisplaced fracture of proximal end of left humerus with routine healing, subsequent encounter  Weakness      Subjective Assessment - 01/29/16 0807    Subjective Pt states trying to sleep on her bed yesterday. Felt an ache L posterior shoulder after an hour. L shoulder feels fine today. No pain.   Pertinent History L proximal humeral fracture on 12/15/2015 secondary a fall from slipping on ice while walking [place] parking lot at [employer].  Wore an immobilizer from 12/15/15 to 01/15/16 until she went to her orthopedic doctor. Currently using a sling. MD stated that her arm is starting to calcify and her next MD appointment is 02/12/16 (at 9:15 am). Per pt, MD told her to keep her L arm in a sling, and not to drive.   Pt also states that her MD said she can type  with her L hand.    Patient Stated Goals Pt expresses desire to be able to lift her L arm straight up. Hold her 69 month old grandson.    Currently in Pain? No/denies   Pain Score 0-No pain   Multiple Pain Sites No       Objectives  Manual therapy:   Soft tissue mobilization to L pectoralis major distal insertion in supine  Soft tissue mobilization to L scalene muscles   Soft tissue mobilization to L rhomboid minor  Soft tissue mobilization to L terres major muscle in sitting   Pt husband education on soft tissue mobilization to L or right rhomboid muscle so he can help his wife at home. Pt husband demonstrated and verbalized understanding.     There-ex  Directed patient with supine L shoulder flexion PROM using R UE 10x3   (reviewed and given as part of her HEP, discomfort free ROM; pt demonstrated and verbalized understanding)   Supine L shoulder scaption PROM 10x3 (by PT)   Standing L elbow flexion using R UE 10x3 (to promote flexion/extension ROM)    seated bilateral scapular retraction with sling on 10x3 with 5 second holds      Improved exercise technique, movement at target joints, use of target muscles after min to mod verbal, visual, tactile cues.    Pt currently 6 weeks since injury. Pt tolerated session well without aggravation of symptoms. Decreased L scalene, L rhomboid, L pectoralis major muscle tension after manual therapy.                PT Education -  01/29/16 0855    Education provided Yes   Education Details Ther-ex, HEP   Person(s) Educated Patient;Spouse   Methods Explanation;Demonstration;Tactile cues;Verbal cues;Handout   Comprehension Verbalized understanding;Returned demonstration             PT Long Term Goals - 01/22/16 1935    PT LONG TERM GOAL #1   Title Patient will have at least 100 degrees L shoulder flexion and scaption PROM to promote ability to raise her L arm when appropriate.    Baseline PROM not measured yet to allow for more healing of fracture.    Time 4   Period Weeks   Status New   PT LONG TERM GOAL #2   Title Patient will improve her Quick Dash Disability/Symptom score by at least 10 % as a demonstration of improved  function.    Baseline 46%   Time 4   Period Weeks   Status New   PT LONG TERM GOAL #3   Title When appropriate, patient will have at least 100 degrees L shoulder flexion and scaption AAROM against gravity to promote ability to raise her L arm.   Baseline Not assessed at this time to allow for more healing of fracture.    Time 4   Period Weeks   Status New               Plan - 01/29/16 [id number]    Clinical Impression Statement Pt currently 6 weeks since injury. Pt tolerated session well without aggravation of symptoms. Decreased L scalene, L rhomboid, L pectoralis major muscle tension after manual therapy.    Pt will benefit from skilled therapeutic intervention in order to improve on the following deficits Decreased range of motion;Postural dysfunction;Decreased strength;Pain   Rehab Potential Good   Clinical Impairments Affecting Rehab Potential healing time   PT Frequency 2x / week   PT Duration 4 weeks   PT Treatment/Interventions Therapeutic exercise;Therapeutic activities;Manual techniques;Electrical Stimulation;Neuromuscular re-education;Patient/family education;Ultrasound   PT Next Visit Plan soft tissue mobilization, gentle ROM, scapular movement, posture   Consulted and Agree with Plan of Care Patient;Family member/caregiver   Family Member Consulted husband present        Problem List There are no active problems to display for this patient.   Joneen Boers PT, DPT   01/29/2016, 9:10 AM  [place] [facility] [address] [place], [address] Phone: [phone]   Fax:  [phone]  Name: Cassandra Miller MRN: [id number] Date of Birth: Dec 05, 1951

## 2016-02-03 ENCOUNTER — Ambulatory Visit: Payer: BLUE CROSS/BLUE SHIELD

## 2016-02-03 DIAGNOSIS — R531 Weakness: Secondary | ICD-10-CM

## 2016-02-03 DIAGNOSIS — S42295D Other nondisplaced fracture of upper end of left humerus, subsequent encounter for fracture with routine healing: Secondary | ICD-10-CM | POA: Diagnosis not present

## 2016-02-03 NOTE — Therapy (Signed)
[place] [facility] [address] [place], [address] Phone: [phone]   Fax:  [phone]  Physical Therapy Treatment  Patient Details  Name: Cassandra Miller MRN: [id number] Date of Birth: 02/10/1951 Referring Provider: Damaris Hippo, PA  Encounter Date: 02/03/2016      PT End of Session - 02/03/16 0802    Visit Number 4   Number of Visits 9   Date for PT Re-Evaluation 02/20/16   Authorization Type 4   Authorization Time Period of 10   PT Start Time 0802   PT Stop Time 0848   PT Time Calculation (min) 46 min   Activity Tolerance Patient tolerated treatment well   Behavior During Therapy [facility] for tasks assessed/performed      Past Medical History  Diagnosis Date  . Hypertension     Past Surgical History  Procedure Laterality Date  . Cesarean section      There were no vitals filed for this visit.  Visit Diagnosis:  Weakness  Other closed nondisplaced fracture of proximal end of left humerus with routine healing, subsequent encounter      Subjective Assessment - 02/03/16 0806    Subjective 1/10 L shoulder pain currently. 6/10 L arm pain yesterday. Pt states that she might have over did it with the exercises. She added a fourth set of 10 with the PROM. Better today.    Pertinent History L proximal humeral fracture on 12/15/2015 secondary a fall from slipping on ice while walking [place] parking lot [place] [employer].  Wore an immobilizer from 12/15/15 to 01/15/16 until she went to her orthopedic doctor. Currently using a sling. MD stated that her arm is starting to calcify and her next MD appointment is 02/12/16 (at 9:15 am). Per pt, MD told her to keep her L arm in a sling, and not to drive.   Pt also states that her MD said she can type  with her L hand.    Patient Stated Goals Pt expresses desire to be able to lift her L arm straight up. Hold her 41 month old grandson.    Currently in Pain? Yes   Pain Score 1    Multiple  Pain Sites No             Objectives  Manual therapy:   Soft tissue mobilization to L pectoralis major distal insertion in supine  Soft tissue mobilization to L scalene muscles   Soft tissue mobilization to L and R rhomboid minor muscles  Soft tissue mobilization to L terres major muscle in sitting       There-ex  Directed patient with seated bilateral scap retraction 10x3 with 5 second holds  supine L shoulder flexion PROM using R UE 10x  Supine L shoulder scaption PROM 10x3 (by PT) Standing L elbow flexion using R UE 10x3 with 5 second holds in extension (to promote flexion/extension ROM)    Improved exercise technique, movement at target joints, use of target muscles after min to mod verbal, visual, tactile cues.       Pt tolerated session well without aggravation of symptoms. Decreased L and R rhomboid muscle and L scalene muscle tension after manual therapy.           PT Education - 02/03/16 0845    Education provided Yes   Education Details the-ex   Person(s) Educated Patient   Methods Explanation;Tactile cues;Demonstration;Verbal cues   Comprehension Verbalized understanding;Returned demonstration  PT Long Term Goals - 01/22/16 1935    PT LONG TERM GOAL #1   Title Patient will have at least 100 degrees L shoulder flexion and scaption PROM to promote ability to raise her L arm when appropriate.    Baseline PROM not measured yet to allow for more healing of fracture.    Time 4   Period Weeks   Status New   PT LONG TERM GOAL #2   Title Patient will improve her Quick Dash Disability/Symptom score by at least 10 % as a demonstration of improved function.    Baseline 46%   Time 4   Period Weeks   Status New   PT LONG TERM GOAL #3   Title When appropriate, patient will have at least 100 degrees L shoulder flexion and scaption AAROM against gravity to promote ability to raise her L arm.   Baseline Not assessed at  this time to allow for more healing of fracture.    Time 4   Period Weeks   Status New               Plan - 02/03/16 [id number]    Clinical Impression Statement Pt tolerated session well without aggravation of symptoms. Decreased L and R rhomboid muscle and L scalene muscle tension after manual therapy.    Pt will benefit from skilled therapeutic intervention in order to improve on the following deficits Decreased range of motion;Postural dysfunction;Decreased strength;Pain   Rehab Potential Good   Clinical Impairments Affecting Rehab Potential healing time   PT Frequency 2x / week   PT Duration 4 weeks   PT Treatment/Interventions Therapeutic exercise;Therapeutic activities;Manual techniques;Electrical Stimulation;Neuromuscular re-education;Patient/family education;Ultrasound   PT Next Visit Plan soft tissue mobilization, gentle ROM, scapular movement, posture   Consulted and Agree with Plan of Care Patient;Family member/caregiver   Family Member Consulted husband present        Problem List There are no active problems to display for this patient.   Joneen Boers PT, DPT   02/03/2016, 9:39 AM  [place] [facility] [address], [place], [address] Phone: [phone]   Fax:  [phone]  Name: Cassandra Miller MRN: [id number] Date of Birth: 1951/01/11

## 2016-02-06 ENCOUNTER — Ambulatory Visit: Payer: BLUE CROSS/BLUE SHIELD | Attending: Student

## 2016-02-06 DIAGNOSIS — R531 Weakness: Secondary | ICD-10-CM | POA: Diagnosis present

## 2016-02-06 DIAGNOSIS — X58XXXD Exposure to other specified factors, subsequent encounter: Secondary | ICD-10-CM | POA: Diagnosis not present

## 2016-02-06 DIAGNOSIS — S42295D Other nondisplaced fracture of upper end of left humerus, subsequent encounter for fracture with routine healing: Secondary | ICD-10-CM

## 2016-02-06 NOTE — Therapy (Signed)
[facility] [address] [place], [address] Phone: [phone]   Fax:  [phone]  Physical Therapy Treatment  Patient Details  Name: Cassandra Miller MRN: [id number] Date of Birth: Sep 14, 1951 Referring Provider: Damaris Hippo, PA  Encounter Date: 02/06/2016      PT End of Session - 02/06/16 1659    Visit Number 5   Number of Visits 9   Date for PT Re-Evaluation 02/20/16   Authorization Type 5   Authorization Time Period of 10   PT Start Time 1659   PT Stop Time 1756   PT Time Calculation (min) 57 min   Activity Tolerance Patient tolerated treatment well   Behavior During Therapy [facility] for tasks assessed/performed      Past Medical History  Diagnosis Date  . Hypertension     Past Surgical History  Procedure Laterality Date  . Cesarean section      There were no vitals filed for this visit.  Visit Diagnosis:  Weakness  Other closed nondisplaced fracture of proximal end of left humerus with routine healing, subsequent encounter      Subjective Assessment - 02/06/16 1701    Subjective No L shoulder pain currently. Felt some discomfort yesterday but it rained. Better able to straighten her L arm.    Pertinent History L proximal humeral fracture on 12/15/2015 secondary a fall from slipping on ice while walking [place] the parking lot at church.  Wore an immobilizer from 12/15/15 to 01/15/16 until she went to her orthopedic doctor. Currently using a sling. MD stated that her arm is starting to calcify and her next MD appointment is 02/12/16 (at 9:15 am). Per pt, MD told her to keep her L arm in a sling, and not to drive.   Pt also states that her MD said she can type  with her L hand.    Patient Stated Goals Pt expresses desire to be able to lift her L arm straight up. Hold her 4 month old grandson.    Currently in Pain? No/denies   Pain Score 0-No pain   Multiple Pain Sites No     Follow up visit with MD  02/12/2016    Objectives  Manual therapy:   Soft tissue mobilization to L pectoralis major distal insertion in supine  Soft tissue mobilization to L scalene muscles   Soft tissue mobilization to L and R rhomboid minor muscles  Soft tissue mobilization to L terres major muscle in sitting       There-ex   Supine L shoulder flexion PROM  by PT 10x3  Supine L shoulder scaption PROM 10x3 by PT  Seated bilateral scap retraction with chin tucks 10x3 with 5 second holds  Standing L elbow flexion/extension PROM by PT 10x3 to promote extension ROM   Reviewed current status/progress with L shoulder flexion, scaption, elbow extension ROM with pt.    Improved exercise technique, movement at target joints, use of target muscles after mod verbal, visual, tactile cues.       Patient tolerated session well without complain of pain. Similar ROM compared to previous measurements with very slight improvement with L shoulder flexion and scaption PROM.            PT Education - 02/06/16 1804    Education provided Yes   Education Details ther-ex, progress/current status with L shoulder flexion, scaption, L elbow extension PROM   Person(s) Educated Patient   Methods Explanation;Demonstration;Tactile cues;Verbal cues  Comprehension Verbalized understanding;Returned demonstration             PT Long Term Goals - 01/22/16 1935    PT LONG TERM GOAL #1   Title Patient will have at least 100 degrees L shoulder flexion and scaption PROM to promote ability to raise her L arm when appropriate.    Baseline PROM not measured yet to allow for more healing of fracture.    Time 4   Period Weeks   Status New   PT LONG TERM GOAL #2   Title Patient will improve her Quick Dash Disability/Symptom score by at least 10 % as a demonstration of improved function.    Baseline 46%   Time 4   Period Weeks   Status New   PT LONG TERM GOAL #3   Title When appropriate, patient will  have at least 100 degrees L shoulder flexion and scaption AAROM against gravity to promote ability to raise her L arm.   Baseline Not assessed at this time to allow for more healing of fracture.    Time 4   Period Weeks   Status New               Plan - 02/06/16 1659    Clinical Impression Statement Patient tolerated session well without complain of pain. Similar ROM compared to previous measurements with very slight improvement with L shoulder flexion and scaption PROM.    Pt will benefit from skilled therapeutic intervention in order to improve on the following deficits Decreased range of motion;Postural dysfunction;Decreased strength;Pain   Rehab Potential Good   Clinical Impairments Affecting Rehab Potential healing time   PT Frequency 2x / week   PT Duration 4 weeks   PT Treatment/Interventions Therapeutic exercise;Therapeutic activities;Manual techniques;Electrical Stimulation;Neuromuscular re-education;Patient/family education;Ultrasound   PT Next Visit Plan soft tissue mobilization, gentle ROM, scapular movement, posture   Consulted and Agree with Plan of Care Patient   Family Member Consulted --        Problem List There are no active problems to display for this patient.   Joneen Boers PT, DPT   02/06/2016, 6:10 PM  [facility] [address] [place], [address] Phone: [phone]   Fax:  [phone]  Name: Zanylah Skov Aston MRN: [id number] Date of Birth: Nov 13, 1951

## 2016-02-10 ENCOUNTER — Ambulatory Visit: Payer: BLUE CROSS/BLUE SHIELD

## 2016-02-10 DIAGNOSIS — R531 Weakness: Secondary | ICD-10-CM | POA: Diagnosis not present

## 2016-02-10 DIAGNOSIS — S42295D Other nondisplaced fracture of upper end of left humerus, subsequent encounter for fracture with routine healing: Secondary | ICD-10-CM

## 2016-02-10 NOTE — Therapy (Signed)
[facility] [address] [place], [address] Phone: [phone]   Fax:  [phone]  Physical Therapy Treatment  Patient Details  Name: Cassandra Miller MRN: [id number] Date of Birth: 1951-08-22 Referring Provider: Damaris Hippo, PA  Encounter Date: 02/10/2016      PT End of Session - 02/10/16 1749    Visit Number 6   Number of Visits 9   Date for PT Re-Evaluation 02/20/16   Authorization Type 6   Authorization Time Period of 10   PT Start Time 1749   PT Stop Time 1833   PT Time Calculation (min) 44 min   Activity Tolerance Patient tolerated treatment well   Behavior During Therapy [facility] for tasks assessed/performed      Past Medical History  Diagnosis Date  . Hypertension     Past Surgical History  Procedure Laterality Date  . Cesarean section      There were no vitals filed for this visit.  Visit Diagnosis:  Weakness  Other closed nondisplaced fracture of proximal end of left humerus with routine healing, subsequent encounter      Subjective Assessment - 02/10/16 1827    Subjective Pt states no shoulder pain   Pertinent History L proximal humeral fracture on 12/15/2015 secondary a fall from slipping on ice while walking [place] the parking lot at church.  Wore an immobilizer from 12/15/15 to 01/15/16 until she went to her orthopedic doctor. Currently using a sling. MD stated that her arm is starting to calcify and her next MD appointment is 02/12/16 (at 9:15 am). Per pt, MD told her to keep her L arm in a sling, and not to drive.   Pt also states that her MD said she can type  with her L hand.    Patient Stated Goals Pt expresses desire to be able to lift her L arm straight up. Hold her 61 month old grandson.    Currently in Pain? No/denies   Pain Score 0-No pain   Multiple Pain Sites No       Objectives  Manual therapy:   Soft tissue mobilization to L pectoralis major distal insertion in supine  Soft  tissue mobilization to L scalene muscles   Soft tissue mobilization to L and R rhomboid minor muscles   Soft tissue mobilization to L terres major muscle in sitting with L arm supported on table    There-ex   Supine L shoulder flexion PROM by PT 10x3  Supine L shoulder scaption PROM 10x3 by PT  Seated bilateral scap retraction with chin tucks 10x3 with 5 second holds     Improved exercise technique, movement at target joints, use of target muscles after mod verbal, visual, tactile cues.     Pt tolerated session well without complain of L shoulder pain. Decreased L and R rhomboid, L scalene, L teres major, L pectoralis major (distal fibers) muscle tension after manual therapy. Pt demonstrates approximately 90 degrees L shoulder flexion PROM with some stiffness at end range.                 PT Education - 02/10/16 1831    Education provided Yes   Education Details ther-ex   [employer]) Educated Patient   Methods Explanation;Demonstration;Tactile cues;Verbal cues   Comprehension Verbalized understanding;Returned demonstration             PT Long Term Goals - 01/22/16 1935    PT LONG TERM GOAL #1  Title Patient will have at least 100 degrees L shoulder flexion and scaption PROM to promote ability to raise her L arm when appropriate.    Baseline PROM not measured yet to allow for more healing of fracture.    Time 4   Period Weeks   Status New   PT LONG TERM GOAL #2   Title Patient will improve her Quick Dash Disability/Symptom score by at least 10 % as a demonstration of improved function.    Baseline 46%   Time 4   Period Weeks   Status New   PT LONG TERM GOAL #3   Title When appropriate, patient will have at least 100 degrees L shoulder flexion and scaption AAROM against gravity to promote ability to raise her L arm.   Baseline Not assessed at this time to allow for more healing of fracture.    Time 4   Period Weeks   Status New                Plan - 02/10/16 1831    Clinical Impression Statement Pt tolerated session well without complain of L shoulder pain. Decreased L and R rhomboid, L scalene, L teres major, L pectoralis major (distal fibers) muscle tension after manual therapy. Pt demonstrates approximately 90 degrees L shoulder flexion PROM with some stiffness at end range.   Pt will benefit from skilled therapeutic intervention in order to improve on the following deficits Decreased range of motion;Postural dysfunction;Decreased strength;Pain   Rehab Potential Good   Clinical Impairments Affecting Rehab Potential healing time   PT Frequency 2x / week   PT Duration 4 weeks   PT Treatment/Interventions Therapeutic exercise;Therapeutic activities;Manual techniques;Electrical Stimulation;Neuromuscular re-education;Patient/family education;Ultrasound   PT Next Visit Plan soft tissue mobilization, gentle ROM, scapular movement, posture   Consulted and Agree with Plan of Care Patient        Problem List There are no active problems to display for this patient.   Joneen Boers PT, DPT   02/10/2016, 6:50 PM  [facility] [address] [place], [address] Phone: [phone]   Fax:  [phone]  Name: Cassandra Miller MRN: [id number] Date of Birth: 1951-04-13

## 2016-02-12 ENCOUNTER — Ambulatory Visit: Payer: BLUE CROSS/BLUE SHIELD

## 2016-02-12 DIAGNOSIS — R531 Weakness: Secondary | ICD-10-CM | POA: Diagnosis not present

## 2016-02-12 DIAGNOSIS — S42295D Other nondisplaced fracture of upper end of left humerus, subsequent encounter for fracture with routine healing: Secondary | ICD-10-CM

## 2016-02-12 NOTE — Therapy (Signed)
[facility] [address] [place], [address] Phone: [phone]   Fax:  [phone]  Physical Therapy Treatment And Progress Report  Patient Details  Name: Cassandra Miller MRN: [id number] Date of Birth: [birth year] Referring Provider: Damaris Hippo, PA  Encounter Date: 02/12/2016      PT End of Session - 02/12/16 0804    Visit Number 7   Number of Visits 9   Date for PT Re-Evaluation 02/20/16   Authorization Type 7   Authorization Time Period of 10   PT Start Time 0804   PT Stop Time 0846   PT Time Calculation (min) 42 min   Activity Tolerance Patient tolerated treatment well   Behavior During Therapy [facility] for tasks assessed/performed      Past Medical History  Diagnosis Date  . Hypertension     Past Surgical History  Procedure Laterality Date  . Cesarean section      There were no vitals filed for this visit.  Visit Diagnosis:  Weakness  Other closed nondisplaced fracture of proximal end of left humerus with routine healing, subsequent encounter      Subjective Assessment - 02/12/16 0804    Subjective Pt states L shoulder is pretty good. No pain.   Pertinent History L proximal humeral fracture on 12/15/2015 secondary a fall from slipping on ice while walking [place] parking lot at [place].  Wore an immobilizer from 12/15/15 to 01/15/16 until she went to her orthopedic doctor. Currently using a sling. MD stated that her arm is starting to calcify and her next MD appointment is 02/12/16 (at 9:15 am). Per pt, MD told her to keep her L arm in a sling, and not to drive.   Pt also states that her MD said she can type  with her L hand.    Patient Stated Goals Pt expresses desire to be able to lift her L arm straight up. Hold her 69 month old grandson.    Currently in Pain? No/denies   Pain Score 0-No pain   Multiple Pain Sites No            [facility] PT Assessment - 02/12/16 0842    PROM   Overall PROM Comments  L elbow extension ROM measured on 02/06/2016   Left Shoulder Flexion 100 Degrees  supine position   Left Shoulder ABduction 83 Degrees  supine position   Left Elbow Extension -16  with hand in neutral; -19 with hand in supination          Objectives  Manual therapy:   Soft tissue mobilization to L pectoralis major distal insertion in supine  Soft tissue mobilization to L scalene/lateral neck muscles  Soft tissue mobilization to L and R rhomboid minor muscles   Soft tissue mobilization to L terres major muscle in sitting with L arm supported on table    There-ex   Supine L shoulder flexion PROM by PT 10x3  Supine L shoulder scaption PROM 10x3 by PT  Reviewed progress/current status with L shoulder ROM with pt.     Pt tolerated session well without aggravation of symptoms. Pt demonstrates improved L shoulder flexion PROM by 10 degrees and L shoulder scaption PROM by 11 degrees since measured initially. Patient making progress towards goals and would benefit from continued skilled physical therapy services to continue improving L shoulder ROM, and function.        PT Education - 02/12/16 1541    Education provided  Yes   Education Details ther-ex, progress/current status with ROM   Person(s) Educated Patient   Methods Explanation   Comprehension Verbalized understanding             PT Long Term Goals - 02/12/16 1555    PT LONG TERM GOAL #1   Title Patient will have at least 100 degrees L shoulder flexion and scaption PROM to promote ability to raise her L arm when appropriate.    Time 4   Period Weeks   Status Partially Met   PT LONG TERM GOAL #2   Title Patient will improve her Quick Dash Disability/Symptom score by at least 10 % as a demonstration of improved function.    Time 4   Period Weeks   Status On-going   PT LONG TERM GOAL #3   Title When appropriate, patient will have at least 100 degrees L shoulder flexion and scaption AAROM  against gravity to promote ability to raise her L arm.   Baseline Not assessed secondary to pt currently PROM per MD   Time 4   Period Weeks   Status Unable to assess               Plan - 02/12/16 0804    Clinical Impression Statement Pt tolerated session well without aggravation of symptoms. Pt demonstrates improved L shoulder flexion PROM by 10 degrees and L shoulder scaption PROM by 11 degrees since measured initially. Patient making progress towards goals and would benefit from continued skilled physical therapy services to continue improving L shoulder ROM, and function.    Pt will benefit from skilled therapeutic intervention in order to improve on the following deficits Decreased range of motion;Postural dysfunction;Decreased strength;Pain   Rehab Potential Good   Clinical Impairments Affecting Rehab Potential healing time   PT Frequency 2x / week   PT Duration 4 weeks   PT Treatment/Interventions Therapeutic exercise;Therapeutic activities;Manual techniques;Electrical Stimulation;Neuromuscular re-education;Patient/family education;Ultrasound   PT Next Visit Plan soft tissue mobilization, gentle ROM, scapular movement, posture   Consulted and Agree with Plan of Care Patient        Problem List There are no active problems to display for this patient.  Thank you for your referral.   Joneen Boers PT, DPT   02/12/2016, 4:04 PM  [facility] [address], [place], [address] Phone: [phone]   Fax:  [phone]  Name: Amarya Kuehl Coach MRN: [id number] Date of Birth: [birth year]

## 2016-02-18 ENCOUNTER — Ambulatory Visit: Payer: BLUE CROSS/BLUE SHIELD

## 2016-02-18 DIAGNOSIS — R531 Weakness: Secondary | ICD-10-CM | POA: Diagnosis not present

## 2016-02-18 DIAGNOSIS — S42295D Other nondisplaced fracture of upper end of left humerus, subsequent encounter for fracture with routine healing: Secondary | ICD-10-CM

## 2016-02-18 NOTE — Therapy (Signed)
[facility] [address] [place], [address] Phone: [phone]   Fax:  [phone]  Physical Therapy Treatment  Patient Details  Name: Cassandra Miller MRN: [id number] Date of Birth: 1951-05-29 Referring Provider: Damaris Hippo, PA  Encounter Date: 02/18/2016       PT End of Session - 02/18/16 0948    Visit Number 8   Number of Visits 9   Date for PT Re-Evaluation 02/20/16   Authorization Type 8   Authorization Time Period of 10   PT Start Time 0948   PT Stop Time 1033   PT Time Calculation (min) 45 min   Activity Tolerance Patient tolerated treatment well   Behavior During Therapy [facility] for tasks assessed/performed      Past Medical History  Diagnosis Date  . Hypertension     Past Surgical History  Procedure Laterality Date  . Cesarean section      There were no vitals filed for this visit.  Visit Diagnosis:  Weakness  Other closed nondisplaced fracture of proximal end of left humerus with routine healing, subsequent encounter      Subjective Assessment - 02/18/16 0950    Subjective Pt states x-rays were taken and  L shoulder is healed per PA. Next follow up appointment is 03/16/16. Pt able to hold her hair dryer.  Cleared to drive.    Pertinent History L proximal humeral fracture on 12/15/2015 secondary a fall from slipping on ice while walking [place] parking lot at [place].  Wore an immobilizer from 12/15/15 to 01/15/16 until she went to her orthopedic doctor. Currently using a sling. MD stated that her arm is starting to calcify and her next MD appointment is 02/12/16 (at 9:15 am). Per pt, MD told her to keep her L arm in a sling, and not to drive.   Pt also states that her MD said she can type  with her L hand.    Diagnostic tests Per PA note on 02/12/16: "x-rays demonstrate an essentially nondisplaced oblique fracture of the left surgical neck of the humerus with moderate to excellent callus formation"   Patient Stated Goals Pt expresses desire to be able to lift her L arm straight up. Hold her 51 month old grandson.    Currently in Pain? No/denies   Pain Score 0-No pain  Achy   Pain Orientation Left   Multiple Pain Sites No    Pt adds MD said not to lift anything more than 10-15 lbs with L arm.          [facility] PT Assessment - 02/18/16 1152    Assessment   Next MD Visit 03/16/16   Precautions   Precaution Comments AROM, no lifting greater than 15 lbs. Continue increasing weight bearing status L UE.   per PA note      Objectives  At start of session  Standing: 94 degrees L shoulder flexion AROM, 82 degrees L shoulder scaption AROM supine: 104 L shoulder flexion AROM, 100 degrees L shoulder scaption AROM  Manual therapy:   Soft tissue mobilization to L subscapularis and teres major muscles with L shoulder in about 100 degrees flexion in supine.    There-ex  Directed patient with standing L shoulder flexion AROM 3x  Standing L shoulder scaption 1x  Supine L shoulder flexion AROM 10x3 (reviewed and given as part of her HEP 10x3 twice daily; pt demonstrated and verbalized understanding)  Supine L shoulder scaption AROM 10x3 (reviewed  and given as part of her HEP 10x3 twice daily; pt demonstrated and verbalized understanding)  Standing L shoulder UE ranger AAROM: flexion 10x2             scaption 10x2,            Cues for not shrugging L shoulder  Ball roll up the wall 10x5 second holds, cues for not shrugging L shoulder  Reviewed plan of care with pt.   Improved exercise technique, movement at target joints, use of target muscles after mod verbal, visual, tactile cues.     Standing L shoulder flexion AROM improved to 100 degrees. Pt tolerated session well without aggravation of L shoulder symptoms. Pt now able to perform AAROM to AROM. Needed cues to prevent L shoulder shrug and back extension compensation.                        PT  Education - 02/18/16 1014    Education provided Yes   Education Details ther-ex, HEP   Person(s) Educated Patient   Methods Explanation;Demonstration;Tactile cues;Verbal cues   Comprehension Verbalized understanding;Returned demonstration             PT Long Term Goals - 02/12/16 1555    PT LONG TERM GOAL #1   Title Patient will have at least 100 degrees L shoulder flexion and scaption PROM to promote ability to raise her L arm when appropriate.    Time 4   Period Weeks   Status Partially Met   PT LONG TERM GOAL #2   Title Patient will improve her Quick Dash Disability/Symptom score by at least 10 % as a demonstration of improved function.    Time 4   Period Weeks   Status On-going   PT LONG TERM GOAL #3   Title When appropriate, patient will have at least 100 degrees L shoulder flexion and scaption AAROM against gravity to promote ability to raise her L arm.   Baseline Not assessed secondary to pt currently PROM per MD   Time 4   Period Weeks   Status Unable to assess               Plan - 02/18/16 1015    Clinical Impression Statement Standing L shoulder flexion AROM improved to 100 degrees. Pt tolerated session well without aggravation of L shoulder symptoms. Pt now able to perform AAROM to AROM. Needed cues to prevent L shoulder shrug and back extension compensation.    Pt will benefit from skilled therapeutic intervention in order to improve on the following deficits Decreased range of motion;Postural dysfunction;Decreased strength;Pain   Rehab Potential Good   Clinical Impairments Affecting Rehab Potential healing time   PT Frequency 2x / week   PT Duration 4 weeks   PT Treatment/Interventions Therapeutic exercise;Therapeutic activities;Manual techniques;Electrical Stimulation;Neuromuscular re-education;Patient/family education;Ultrasound   PT Next Visit Plan soft tissue mobilization, gentle ROM, scapular movement, posture   Consulted and Agree with Plan of Care  Patient        Problem List There are no active problems to display for this patient.   Joneen Boers PT, DPT   02/18/2016, 12:05 PM  [facility] [address] [place], [address] Phone: [phone]   Fax:  [phone]  Name: Reginia Battie Cardon MRN: [id number] Date of Birth: [birth year]

## 2016-02-20 ENCOUNTER — Ambulatory Visit: Payer: BLUE CROSS/BLUE SHIELD

## 2016-02-20 DIAGNOSIS — R531 Weakness: Secondary | ICD-10-CM | POA: Diagnosis not present

## 2016-02-20 DIAGNOSIS — S42295D Other nondisplaced fracture of upper end of left humerus, subsequent encounter for fracture with routine healing: Secondary | ICD-10-CM

## 2016-02-20 NOTE — Patient Instructions (Signed)
    You can keep your hand in a pillow case so that it slides well:   Slide your left hand up the wall, hold for 5 seconds, repeat 10 times, do 2 sets daily.  Slide your L hand up the wall diagonally ("Y"), hold for 5 seconds, repeat 10 times, do 2 sets daily.

## 2016-02-20 NOTE — Therapy (Signed)
[place] [facility] [address], [place], [address] Phone: [phone]   Fax:  [phone]  Physical Therapy Treatment And Progress Report  Patient Details  Name: Cassandra Miller MRN: [id number] Date of Birth: Apr 26, 1951 Referring Provider: Damaris Hippo, PA  Encounter Date: 02/20/2016      PT End of Session - 02/20/16 1435    Visit Number 9   Number of Visits 17   Date for PT Re-Evaluation 03/20/16   Authorization Type 1   Authorization Time Period of 10   PT Start Time 1435   PT Stop Time 1522   PT Time Calculation (min) 47 min   Activity Tolerance Patient tolerated treatment well   Behavior During Therapy [facility] for tasks assessed/performed      Past Medical History  Diagnosis Date  . Hypertension     Past Surgical History  Procedure Laterality Date  . Cesarean section      There were no vitals filed for this visit.  Visit Diagnosis:  Weakness - Plan: PT plan of care cert/re-cert  Other closed nondisplaced fracture of proximal end of left humerus with routine healing, subsequent encounter - Plan: PT plan of care cert/re-cert      Subjective Assessment - 02/20/16 1437    Subjective L shoulder is doing pretty good. No pain or discomfort.    Pertinent History L proximal humeral fracture on 12/15/2015 secondary a fall from slipping on ice while walking [place] parking [place] at [place].  Wore an immobilizer from 12/15/15 to 01/15/16 until she went to her orthopedic doctor. Currently using a sling. MD stated that her arm is starting to calcify and her next MD appointment is 02/12/16 (at 9:15 am). Per pt, MD told her to keep her L arm in a sling, and not to drive.   Pt also states that her MD said she can type  with her L hand.    Diagnostic tests Per PA note on 02/12/16: "x-rays demonstrate an essentially nondisplaced oblique fracture of the left surgical neck of the humerus with moderate to excellent callus formation"   Patient Stated Goals Pt expresses desire to be able to lift her L arm straight up. Hold her 48 month old grandson.    Currently in Pain? No/denies   Pain Score 0-No pain   Multiple Pain Sites No            [facility] PT Assessment - 02/20/16 1510    Assessment   Next MD Visit 03/16/16   Precautions   Precaution Comments AROM, no lifting greater than 15 lbs. Continue increasing weight bearing status L UE.   per PA note   AROM   Left Shoulder Flexion 110 Degrees  after manual therapy   Left Shoulder ABduction 103 Degrees  scaption AROM after manual therapy       Objectives    Manual therapy:   Soft tissue mobilization to L  teres major muscles with L shoulder in about 100 degrees flexion in supine. (110 degrees L shoulder flexion; 103 L shoulder scaption afterwards)  And sitting with L arm propped in about 80 degrees flexion    There-ex  Directed patient with standing L shoulder flexion and scaption  AROM multiple times  Reviewed progress/current status with L shoulder AROM with pt.  Wall slides for L shoulder flexion 10x2 with 5 second holds   L shoulder scaption 10x2 with 5 second holds   Reviewed and given as part of  her HEP. Pt demonstrated and verbalized understanding.   Ball rolls up the wall 10x5 second holds, cues for not shrugging L shoulder for 2 sets  Prone L scapular retraction 10x2 with 5 second holds  Reviewed plan of care with pt.    Improved exercise technique, movement at target joints, use of target muscles after mod verbal, visual, tactile cues.     Patient has demonstrated improved L shoulder flexion and scaption AROM since initial evaluation. Stiff end feel. Pt still demonstrates limited L shoulder AROM, difficulty raising her L arm as well as weakness and will benefit from continued skilled physical therapy intervention to address the aforementioned deficits.                         PT Education - 02-26-16 1529    Education  provided Yes   Education Details ther-ex, plan of care   Person(s) Educated Patient   Methods Explanation;Demonstration;Tactile cues;Verbal cues   Comprehension Verbalized understanding;Returned demonstration             PT Long Term Goals - 2016-02-26 1531    PT LONG TERM GOAL #1   Title Patient will have at least 100 degrees L shoulder flexion and scaption PROM to promote ability to raise her L arm when appropriate.    Time 4   Period Weeks   Status Achieved   PT LONG TERM GOAL #2   Title Patient will improve her Quick Dash Disability/Symptom score by at least 10 % as a demonstration of improved function.    Time 4   Period Weeks   Status On-going   PT LONG TERM GOAL #3   Title When appropriate, patient will have at least 100 degrees L shoulder flexion and scaption AAROM against gravity to promote ability to raise her L arm.   Time 4   Period Weeks   Status Achieved   PT LONG TERM GOAL #4   Title Patient will have at least 140 degrees L shoulder flexion AROM and at least 130 degrees L shoulder scaption AROM to promote ability to reach, perform functional tasks such as fixing her hair.    Time 4   Period Weeks   Status New               Plan - 2016/02/26 1531    Clinical Impression Statement Patient has demonstrated improved L shoulder flexion and scaption AROM since initial evaluation. Stiff end feel. Pt still demonstrates limited L shoulder AROM, difficulty raising her L arm as well as weakness and will benefit from continued skilled physical therapy intervention to address the aforementioned deficits.    Pt will benefit from skilled therapeutic intervention in order to improve on the following deficits Decreased range of motion;Postural dysfunction;Decreased strength;Pain   Rehab Potential Good   Clinical Impairments Affecting Rehab Potential healing time   PT Frequency 2x / week   PT Duration 4 weeks   PT Treatment/Interventions Therapeutic exercise;Therapeutic  activities;Manual techniques;Electrical Stimulation;Neuromuscular re-education;Patient/family education;Ultrasound   PT Next Visit Plan soft tissue mobilization, gentle ROM, scapular movement, posture   Consulted and Agree with Plan of Care Patient          G-Codes - 2016-02-26 1553    Functional Assessment Tool Used patient interview, clinical presentation, clinical judgement   Functional Limitation Carrying, moving and handling objects   Carrying, Moving and Handling Objects Current Status [id number]) At least 40 percent but less than 60 percent impaired, limited or restricted  Carrying, Moving and Handling Objects Goal Status [id number]) At least 20 percent but less than 40 percent impaired, limited or restricted      Problem List There are no active problems to display for this patient.  Thank you for your referral.   Joneen Boers PT, DPT    02/20/2016, 4:08 PM  [place] [facility] [address] [place], [address] Phone: [phone]   Fax:  [phone]  Name: Cassandra Miller MRN: [id number] Date of Birth: 11-15-1951

## 2016-02-24 ENCOUNTER — Other Ambulatory Visit: Payer: Self-pay | Admitting: Internal Medicine

## 2016-02-24 DIAGNOSIS — Z1231 Encounter for screening mammogram for malignant neoplasm of breast: Secondary | ICD-10-CM

## 2016-02-25 ENCOUNTER — Ambulatory Visit: Payer: BLUE CROSS/BLUE SHIELD

## 2016-02-25 DIAGNOSIS — R531 Weakness: Secondary | ICD-10-CM | POA: Diagnosis not present

## 2016-02-25 DIAGNOSIS — S42295D Other nondisplaced fracture of upper end of left humerus, subsequent encounter for fracture with routine healing: Secondary | ICD-10-CM

## 2016-02-25 NOTE — Therapy (Signed)
[place] [facility] [address], [place], [address] Phone: [phone]   Fax:  [phone]  Physical Therapy Treatment  Patient Details  Name: Cassandra Miller MRN: [id number] Date of Birth: 1951/02/14 Referring Provider: Damaris Hippo, PA  Encounter Date: 02/25/2016      PT End of Session - 02/25/16 0801    Visit Number 10   Number of Visits 17   Date for PT Re-Evaluation 03/20/16   Authorization Type 2   Authorization Time Period of 10   PT Start Time 0802   PT Stop Time 0850   PT Time Calculation (min) 48 min   Activity Tolerance Patient tolerated treatment well   Behavior During Therapy [facility] for tasks assessed/performed      Past Medical History  Diagnosis Date  . Hypertension     Past Surgical History  Procedure Laterality Date  . Cesarean section      There were no vitals filed for this visit.  Visit Diagnosis:  Weakness  Other closed nondisplaced fracture of proximal end of left humerus with routine healing, subsequent encounter      Subjective Assessment - 02/25/16 0804    Subjective L shoulder is doing well. No pain. Fingers have been feeling tingly now and again since the injury, and L hand feels week.    Pertinent History L proximal humeral fracture on 12/15/2015 secondary a fall from slipping on ice while walking [place] parking lot at [place].  Wore an immobilizer from 12/15/15 to 01/15/16 until she went to her orthopedic doctor. Currently using a sling. MD stated that her arm is starting to calcify and her next MD appointment is 02/12/16 (at 9:15 am). Per pt, MD told her to keep her L arm in a sling, and not to drive.   Pt also states that her MD said she can type  with her L hand.    Diagnostic tests Per PA note on 02/12/16: "x-rays demonstrate an essentially nondisplaced oblique fracture of the left surgical neck of the humerus with moderate to excellent callus formation"   Patient Stated Goals Pt  expresses desire to be able to lift her L arm straight up. Hold her 23 month old grandson.    Currently in Pain? No/denies   Multiple Pain Sites No            [facility] PT Assessment - 02/25/16 0807    Strength   Overall Strength Comments Grip: L: 20 lbs, 21 lbs, 26 lbs; R: 54 lbs, 49 lbs, 51 lbs         Objectives    Manual therapy:   Soft tissue mobilization to L scalene/L lateral neck muscles  Soft tissue mobilization to L rhomboid minor  Soft tissue mobilization to L teres major muscle with L shoulder in about 100 degrees scaption in supine (120 degrees L shoulder flexion; 108 L shoulder scaption afterwards)     There-ex  Directed patient with standing L shoulder flexion and scaption AROM multiple times  Ball rolls up the wall 10x5 second holds, cues for not shrugging L shoulder for 2 sets  UE ranger AAROM L shoulder: flexion 10x2 with 5 second holds     scaption 10x2 with 5 second holds  Wall push-ups 10x  Improved exercise technique, movement at target joints, use of target muscles after mod verbal, visual, tactile cues.      Improved L shoulder flexion AROM to 120 degrees and L shoulder scaption to 108  degrees. Slight L shoulder shrug, R trunk side bending, and trunk extension with raising her arm but improves after cues not to do so. Pt tolerated session well without aggravation of L shoulder symptoms.        PT Education - 02/25/16 0843    Education provided Yes   Education Details ther-ex   Person(s) Educated Patient   Methods Explanation;Demonstration;Tactile cues;Verbal cues   Comprehension Verbalized understanding;Returned demonstration             PT Long Term Goals - 02/20/16 1531    PT LONG TERM GOAL #1   Title Patient will have at least 100 degrees L shoulder flexion and scaption PROM to promote ability to raise her L arm when appropriate.    Time 4   Period Weeks   Status Achieved   PT LONG TERM GOAL #2    Title Patient will improve her Quick Dash Disability/Symptom score by at least 10 % as a demonstration of improved function.    Time 4   Period Weeks   Status On-going   PT LONG TERM GOAL #3   Title When appropriate, patient will have at least 100 degrees L shoulder flexion and scaption AAROM against gravity to promote ability to raise her L arm.   Time 4   Period Weeks   Status Achieved   PT LONG TERM GOAL #4   Title Patient will have at least 140 degrees L shoulder flexion AROM and at least 130 degrees L shoulder scaption AROM to promote ability to reach, perform functional tasks such as fixing her hair.    Time 4   Period Weeks   Status New               Plan - 02/25/16 0810    Clinical Impression Statement Pt currently 10 weeks since injury. Improved L shoulder flexion AROM to 120 degrees and L shoulder scaption to 108 degrees. Slight L shoulder shrug, R trunk side bending, and trunk extension with raising her arm but improves after cues not to do so. Pt tolerated session well without aggravation of L shoulder symptoms.   Pt will benefit from skilled therapeutic intervention in order to improve on the following deficits Decreased range of motion;Postural dysfunction;Decreased strength;Pain   Rehab Potential Good   Clinical Impairments Affecting Rehab Potential healing time   PT Frequency 2x / week   PT Duration 4 weeks   PT Treatment/Interventions Therapeutic exercise;Therapeutic activities;Manual techniques;Electrical Stimulation;Neuromuscular re-education;Patient/family education;Ultrasound   PT Next Visit Plan soft tissue mobilization, gentle ROM, scapular movement, posture   Consulted and Agree with Plan of Care Patient        Problem List There are no active problems to display for this patient.   Joneen Boers PT, DPT   02/25/2016, 9:02 AM  [place] [facility] [address] [place],  [address] Phone: [phone]   Fax:  [phone]  Name: Keyasia Kruth Nunnelley MRN: [id number] Date of Birth: [birth year]

## 2016-02-27 ENCOUNTER — Ambulatory Visit: Payer: BLUE CROSS/BLUE SHIELD

## 2016-02-27 DIAGNOSIS — S42295D Other nondisplaced fracture of upper end of left humerus, subsequent encounter for fracture with routine healing: Secondary | ICD-10-CM

## 2016-02-27 DIAGNOSIS — R531 Weakness: Secondary | ICD-10-CM

## 2016-02-27 NOTE — Therapy (Signed)
[place] [facility] [address] [place], [address] Phone: [phone]   Fax:  [phone]  Physical Therapy Treatment  Patient Details  Name: Cassandra Miller MRN: [id number] Date of Birth: March 21, 1951 Referring Provider: Damaris Hippo, PA  Encounter Date: 02/27/2016      PT End of Session - 02/27/16 0848    Visit Number 11   Number of Visits 17   Date for PT Re-Evaluation 03/20/16   Authorization Type 3   Authorization Time Period of 10   PT Start Time 0847   PT Stop Time 0931   PT Time Calculation (min) 44 min   Activity Tolerance Patient tolerated treatment well   Behavior During Therapy [facility] for tasks assessed/performed      Past Medical History  Diagnosis Date  . Hypertension     Past Surgical History  Procedure Laterality Date  . Cesarean section      There were no vitals filed for this visit.  Visit Diagnosis:  Weakness  Other closed nondisplaced fracture of proximal end of left humerus with routine healing, subsequent encounter      Subjective Assessment - 02/27/16 0847    Subjective L shoulder is doing fine.    Pertinent History L proximal humeral fracture on 12/15/2015 secondary a fall from slipping on ice while walking [place].  Wore an immobilizer from 12/15/15 to 01/15/16 until she went to her orthopedic doctor. Currently using a sling. MD stated that her arm is starting to calcify and her next MD appointment is 02/12/16 (at 9:15 am). Per pt, MD told her to keep her L arm in a sling, and not to drive.   Pt also states that her MD said she can type  with her L hand.    Diagnostic tests Per PA note on 02/12/16: "x-rays demonstrate an essentially nondisplaced oblique fracture of the left surgical neck of the humerus with moderate to excellent callus formation"   Patient Stated Goals Pt expresses desire to be able to lift her L arm straight up. Hold her 81 month old grandson.    Currently  in Pain? No/denies   Pain Score 0-No pain   Multiple Pain Sites No             Objectives    Manual therapy:    Soft tissue mobilization to L levator scapula  Soft tissue mobilization to L teres major muscle with L shoulder in about 110 degrees scaption in supine (120 degrees L shoulder flexion, 113 L shoulder scaption)     There-ex  Directed patient with standing L shoulder flexion and scaption AROM multiple times  UE ranger AAROM L shoulder: flexion 10x2 with 5 second holds scaption 10x2 with 5 second holds  Wall push-ups 10x2  Prone L shoulder scaption 10x2    Improved exercise technique, movement at target joints, use of target muscles after min to mod verbal, visual, tactile cues.     Pt currently 10.7 weeks since injury. Pt tolerated session well without aggravation of L shoulder symptoms. Demonstrates L shoulder shrug and trunk extension and/or side bending compensation with L shoulder flexion and scaption which decreases with cues. L shoulder flexion AROM 120 degrees, L shoulder scaption AROM 113 degrees.                        PT Education - 02/27/16 0911    Education provided Yes   Education  Details ther-ex   Person(s) Educated Patient   Methods Explanation;Demonstration;Tactile cues;Verbal cues   Comprehension Verbalized understanding;Returned demonstration             PT Long Term Goals - 02/20/16 1531    PT LONG TERM GOAL #1   Title Patient will have at least 100 degrees L shoulder flexion and scaption PROM to promote ability to raise her L arm when appropriate.    Time 4   Period Weeks   Status Achieved   PT LONG TERM GOAL #2   Title Patient will improve her Quick Dash Disability/Symptom score by at least 10 % as a demonstration of improved function.    Time 4   Period Weeks   Status On-going   PT LONG TERM GOAL #3   Title When appropriate,  patient will have at least 100 degrees L shoulder flexion and scaption AAROM against gravity to promote ability to raise her L arm.   Time 4   Period Weeks   Status Achieved   PT LONG TERM GOAL #4   Title Patient will have at least 140 degrees L shoulder flexion AROM and at least 130 degrees L shoulder scaption AROM to promote ability to reach, perform functional tasks such as fixing her hair.    Time 4   Period Weeks   Status New               Plan - 02/27/16 0915    Clinical Impression Statement Pt currently 10.7 weeks since injury. Pt tolerated session well without aggravation of L shoulder symptoms. Demonstrates L shoulder shrug and trunk extension and/or side bending compensation with L shoulder flexion and scaption which decreases with cues. L shoulder flexion AROM 120 degrees, L shoulder scaption AROM 113 degrees.   Pt will benefit from skilled therapeutic intervention in order to improve on the following deficits Decreased range of motion;Postural dysfunction;Decreased strength;Pain   Rehab Potential Good   Clinical Impairments Affecting Rehab Potential healing time   PT Frequency 2x / week   PT Duration 4 weeks   PT Treatment/Interventions Therapeutic exercise;Therapeutic activities;Manual techniques;Electrical Stimulation;Neuromuscular re-education;Patient/family education;Ultrasound   PT Next Visit Plan soft tissue mobilization, gentle ROM, scapular movement, posture   Consulted and Agree with Plan of Care Patient        Problem List There are no active problems to display for this patient.   Joneen Boers PT, DPT   02/27/2016, 3:21 PM  [place] [facility] [address], [place], [address] Phone: [phone]   Fax:  [phone]  Name: Cassandra Miller MRN: [id number] Date of Birth: 1951/09/13

## 2016-03-03 ENCOUNTER — Ambulatory Visit: Payer: BLUE CROSS/BLUE SHIELD

## 2016-03-03 DIAGNOSIS — R531 Weakness: Secondary | ICD-10-CM | POA: Diagnosis not present

## 2016-03-03 DIAGNOSIS — S42295D Other nondisplaced fracture of upper end of left humerus, subsequent encounter for fracture with routine healing: Secondary | ICD-10-CM

## 2016-03-03 NOTE — Patient Instructions (Signed)
  Pt was recommended to continue performing her towel slides up the wall for flexion and scaption 10x3 with 5 second holds each direction HEP. Pt verbalized understanding.

## 2016-03-03 NOTE — Therapy (Signed)
[place] [facility] [address] [place], [address] Phone: [phone]   Fax:  [phone]  Physical Therapy Treatment  Patient Details  Name: Cassandra Miller MRN: [id number] Date of Birth: 1951-11-04 Referring Provider: Damaris Hippo, PA  Encounter Date: 03/03/2016      PT End of Session - 03/03/16 0802    Visit Number 12   Number of Visits 17   Date for PT Re-Evaluation 03/20/16   Authorization Type 4   Authorization Time Period of 10   PT Start Time 0802   PT Stop Time 0844   PT Time Calculation (min) 42 min   Activity Tolerance Patient tolerated treatment well   Behavior During Therapy [facility] for tasks assessed/performed      Past Medical History  Diagnosis Date  . Hypertension     Past Surgical History  Procedure Laterality Date  . Cesarean section      There were no vitals filed for this visit.  Visit Diagnosis:  Weakness  Other closed nondisplaced fracture of proximal end of left humerus with routine healing, subsequent encounter      Subjective Assessment - 03/03/16 0804    Subjective L shoulder is doing just fine   Pertinent History L proximal humeral fracture on 12/15/2015 secondary a fall from slipping on ice while walking [place].  Wore an immobilizer from 12/15/15 to 01/15/16 until she went to her orthopedic doctor. Currently using a sling. MD stated that her arm is starting to calcify and her next MD appointment is 02/12/16 (at 9:15 am). Per pt, MD told her to keep her L arm in a sling, and not to drive.   Pt also states that her MD said she can type  with her L hand.    Diagnostic tests Per PA note on 02/12/16: "x-rays demonstrate an essentially nondisplaced oblique fracture of the left surgical neck of the humerus with moderate to excellent callus formation"   Patient Stated Goals Pt expresses desire to be able to lift her L arm straight up. Hold her 43 month old grandson.    Currently in Pain? No/denies   Pain Score 0-No pain   Multiple Pain Sites No        Objectives    Manual therapy:   Soft tissue mobilization to L teres major muscle with L shoulder in about 110 degrees scaption in supine   There-ex  Directed patient with supine manually resisted L shoulder flexion (to promote strength throughout range) 10x2 S/L L shoulder ER 10x2 Wall push-ups 10x3  UE ranger AAROM L shoulder: flexion 10x2 with 5 second holds scaption 10x2 with 5 second holds  Standing L shoulder AROM in front of mirror and low back against wall: L shoulder flexion 10x2                 L shoulder scation 10x2   Improved exercise technique, movement at target joints, use of target muscles after mod verbal, visual, tactile cues.     Still demonstrates lumbar extension and side bending compensation with L shoulder flexion and scaption A/AROM, needing cues to not do so.  122 degrees L shoulder flexion AROM, 121 degrees L shoulder scaption AROM                 PT Education - 03/03/16 0823    Education provided Yes   Education Details ther-ex   Person(s) Educated Patient   Methods Explanation;Demonstration;Tactile cues;Verbal  cues   Comprehension Verbalized understanding;Returned demonstration             PT Long Term Goals - 02/20/16 1531    PT LONG TERM GOAL #1   Title Patient will have at least 100 degrees L shoulder flexion and scaption PROM to promote ability to raise her L arm when appropriate.    Time 4   Period Weeks   Status Achieved   PT LONG TERM GOAL #2   Title Patient will improve her Quick Dash Disability/Symptom score by at least 10 % as a demonstration of improved function.    Time 4   Period Weeks   Status On-going   PT LONG TERM GOAL #3   Title When appropriate, patient will have at least 100 degrees L shoulder flexion and scaption AAROM against gravity to  promote ability to raise her L arm.   Time 4   Period Weeks   Status Achieved   PT LONG TERM GOAL #4   Title Patient will have at least 140 degrees L shoulder flexion AROM and at least 130 degrees L shoulder scaption AROM to promote ability to reach, perform functional tasks such as fixing her hair.    Time 4   Period Weeks   Status New               Plan - 03/03/16 0820    Clinical Impression Statement Still demonstrates lumbar extension and side bending compensation with L shoulder flexion and scaption A/AROM, needing cues to not do so.  122 degrees L shoulder flexion AROM, 121 degrees L shoulder scaption AROM   Pt will benefit from skilled therapeutic intervention in order to improve on the following deficits Decreased range of motion;Postural dysfunction;Decreased strength;Pain   Rehab Potential Good   Clinical Impairments Affecting Rehab Potential healing time   PT Frequency 2x / week   PT Duration 4 weeks   PT Treatment/Interventions Therapeutic exercise;Therapeutic activities;Manual techniques;Electrical Stimulation;Neuromuscular re-education;Patient/family education;Ultrasound   PT Next Visit Plan soft tissue mobilization, gentle ROM, scapular movement, posture   Consulted and Agree with Plan of Care Patient        Problem List There are no active problems to display for this patient.   Joneen Boers PT, DPT   03/03/2016, 8:51 AM  [place] [facility] [address] [place], [address] Phone: [phone]   Fax:  [phone]  Name: Cassandra Miller MRN: [id number] Date of Birth: [birth year]

## 2016-03-05 ENCOUNTER — Ambulatory Visit: Payer: BLUE CROSS/BLUE SHIELD

## 2016-03-05 DIAGNOSIS — R531 Weakness: Secondary | ICD-10-CM | POA: Diagnosis not present

## 2016-03-05 DIAGNOSIS — S42295D Other nondisplaced fracture of upper end of left humerus, subsequent encounter for fracture with routine healing: Secondary | ICD-10-CM

## 2016-03-05 NOTE — Patient Instructions (Signed)
Gave L shoulder ER AAROM using wand or with assist from doorway 10x3 as part of her HEP. Pt demonstrated and verbalized understanding.

## 2016-03-05 NOTE — Therapy (Signed)
[place] [facility] [address], [place], [address] Phone: [phone]   Fax:  [phone]  Physical Therapy Treatment  Patient Details  Name: Cassandra Miller MRN: [id number] Date of Birth: May 24, 1951 Referring Provider: Damaris Hippo, PA  Encounter Date: 03/05/2016      PT End of Session - 03/05/16 1521    Visit Number 13   Number of Visits 17   Date for PT Re-Evaluation 03/20/16   Authorization Type 5   Authorization Time Period of 10   PT Start Time 1521   PT Stop Time 1606   PT Time Calculation (min) 45 min   Activity Tolerance Patient tolerated treatment well   Behavior During Therapy [facility] for tasks assessed/performed      Past Medical History  Diagnosis Date  . Hypertension     Past Surgical History  Procedure Laterality Date  . Cesarean section      There were no vitals filed for this visit.  Visit Diagnosis:  Weakness  Other closed nondisplaced fracture of proximal end of left humerus with routine healing, subsequent encounter      Subjective Assessment - 03/05/16 1526    Subjective L shoulder is doing ok   Pertinent History L proximal humeral fracture on 12/15/2015 secondary a fall from slipping on ice while walking [place] parking lot at [employer].  Wore an immobilizer from 12/15/15 to 01/15/16 until she went to her orthopedic doctor. Currently using a sling. MD stated that her arm is starting to calcify and her next MD appointment is 02/12/16 (at 9:15 am). Per pt, MD told her to keep her L arm in a sling, and not to drive.   Pt also states that her MD said she can type  with her L hand.    Diagnostic tests Per PA note on 02/12/16: "x-rays demonstrate an essentially nondisplaced oblique fracture of the left surgical neck of the humerus with moderate to excellent callus formation"   Patient Stated Goals Pt expresses desire to be able to lift her L arm straight up. Hold her 29 month old grandson.    Currently in  Pain? No/denies   Pain Score 0-No pain   Multiple Pain Sites No          Objectives    Manual therapy:   Soft tissue mobilization to L teres major muscle with L shoulder in about 110 degrees scaption in supine   123 degrees L shoulder flexion AROM, 122 L shoulder scaption AROM afterwards.   There-ex   UE ranger AAROM L shoulder: flexion 10x2 with 5 second holds scaption 10x2 with 5 second holds Wall push-ups 10x3  Standing L shoulder AROM with low back against wall: L shoulder flexion 10x2  L shoulder scation 10x2  Standing L biceps curls 1 lb 10x, 2 lbs 10x, 3 lbs 10x, 4 lbs 10x (easy to medium per pt)  Red T-band triceps extensoin 10x2  R S/L L shoulder ER 10x  Standing L shoulder ER with stick for ROM 10x5 seconds for 2 sets  Improved exercise technique, movement at target joints, use of target muscles after mod verbal, visual, tactile cues.      No complain of L shoulder pain during session. Able to perform bicep curls and triceps extension with resistance. L shoulder flexion AROM: 123 degrees. L shoulder scaption AROM: 122 degrees. Patient will benefit from continued skilled physical therapy services to promote L shoulder AROM, L UE strength, and promote ability  to reach and raise her L arm.                         PT Education - 03/05/16 1526    Education provided Yes   Education Details ther-ex   [employer]) Educated Patient   Methods Explanation;Demonstration;Tactile cues;Verbal cues   Comprehension Verbalized understanding;Returned demonstration             PT Long Term Goals - 02/20/16 1531    PT LONG TERM GOAL #1   Title Patient will have at least 100 degrees L shoulder flexion and scaption PROM to promote ability to raise her L arm when appropriate.    Time 4   Period  Weeks   Status Achieved   PT LONG TERM GOAL #2   Title Patient will improve her Quick Dash Disability/Symptom score by at least 10 % as a demonstration of improved function.    Time 4   Period Weeks   Status On-going   PT LONG TERM GOAL #3   Title When appropriate, patient will have at least 100 degrees L shoulder flexion and scaption AAROM against gravity to promote ability to raise her L arm.   Time 4   Period Weeks   Status Achieved   PT LONG TERM GOAL #4   Title Patient will have at least 140 degrees L shoulder flexion AROM and at least 130 degrees L shoulder scaption AROM to promote ability to reach, perform functional tasks such as fixing her hair.    Time 4   Period Weeks   Status New               Plan - 03/05/16 1527    Clinical Impression Statement No complain of L shoulder pain during session. Able to perform bicep curls and triceps extension with resistance. L shoulder flexion AROM: 123 degrees. L shoulder scaption AROM: 122 degrees. Patient will benefit from continued skilled physical therapy services to promote L shoulder AROM, L UE strength, and promote ability to reach and raise her L arm.    Pt will benefit from skilled therapeutic intervention in order to improve on the following deficits Decreased range of motion;Postural dysfunction;Decreased strength;Pain   Rehab Potential Good   Clinical Impairments Affecting Rehab Potential healing time   PT Frequency 2x / week   PT Duration 4 weeks   PT Treatment/Interventions Therapeutic exercise;Therapeutic activities;Manual techniques;Electrical Stimulation;Neuromuscular re-education;Patient/family education;Ultrasound   PT Next Visit Plan soft tissue mobilization, gentle ROM, scapular movement, posture   Consulted and Agree with Plan of Care Patient        Problem List There are no active problems to display for this patient.    Joneen Boers PT, DPT   03/05/2016, 11:30 PM  [place] [facility] [address], [place], [address] Phone: [phone]   Fax:  [phone]  Name: Cassandra Miller MRN: [id number] Date of Birth: 1950-12-30

## 2016-03-09 ENCOUNTER — Ambulatory Visit: Payer: BLUE CROSS/BLUE SHIELD | Attending: Student

## 2016-03-09 DIAGNOSIS — R531 Weakness: Secondary | ICD-10-CM | POA: Diagnosis present

## 2016-03-09 DIAGNOSIS — S42295D Other nondisplaced fracture of upper end of left humerus, subsequent encounter for fracture with routine healing: Secondary | ICD-10-CM | POA: Diagnosis present

## 2016-03-09 DIAGNOSIS — X58XXXD Exposure to other specified factors, subsequent encounter: Secondary | ICD-10-CM | POA: Diagnosis not present

## 2016-03-09 DIAGNOSIS — M25612 Stiffness of left shoulder, not elsewhere classified: Secondary | ICD-10-CM | POA: Insufficient documentation

## 2016-03-09 DIAGNOSIS — M6281 Muscle weakness (generalized): Secondary | ICD-10-CM | POA: Diagnosis present

## 2016-03-09 NOTE — Therapy (Signed)
[place] [facility] [address] [place], [address] Phone: [phone]   Fax:  [phone]  Physical Therapy Treatment  Patient Details  Name: Cassandra Miller MRN: [id number] Date of Birth: [birth year] Referring Provider: Damaris Hippo, PA  Encounter Date: 03/09/2016      PT End of Session - 03/09/16 0849    Visit Number 14   Number of Visits 17   Date for PT Re-Evaluation 03/20/16   Authorization Type 6   Authorization Time Period of 10   PT Start Time 0850   PT Stop Time 0934   PT Time Calculation (min) 44 min   Activity Tolerance Patient tolerated treatment well   Behavior During Therapy [facility] for tasks assessed/performed      Past Medical History  Diagnosis Date  . Hypertension     Past Surgical History  Procedure Laterality Date  . Cesarean section      There were no vitals filed for this visit.  Visit Diagnosis:  Other closed nondisplaced fracture of proximal end of left humerus with routine healing, subsequent encounter  Weakness      Subjective Assessment - 03/09/16 0852    Subjective L shoulder was achy yesterday (anterior L shoulder); better today.   Pertinent History L proximal humeral fracture on 12/15/2015 secondary a fall from slipping on ice while walking [place] parking lot at [place].  Wore an immobilizer from 12/15/15 to 01/15/16 until she went to her orthopedic doctor. Currently using a sling. MD stated that her arm is starting to calcify and her next MD appointment is 02/12/16 (at 9:15 am). Per pt, MD told her to keep her L arm in a sling, and not to drive.   Pt also states that her MD said she can type  with her L hand.    Diagnostic tests Per PA note on 02/12/16: "x-rays demonstrate an essentially nondisplaced oblique fracture of the left surgical neck of the humerus with moderate to excellent callus formation"   Patient Stated Goals Pt expresses desire to be able to lift her L arm straight up. Hold  her 79 month old grandson.    Currently in Pain? No/denies   Pain Score 0-No pain   Multiple Pain Sites No            [facility] PT Assessment - 03/09/16 0001    Assessment   Next MD Visit 03/16/16   Observation/Other Assessments   Quick DASH  18%       Manual therapy:   Soft tissue mobilization to L teres major muscle with L shoulder in end range scaption in supine  130  degrees L shoulder flexion AROM, 125 L shoulder scaption AROM afterwards.   There-ex  R S/L L shoulder ER 10x3 with 5 second holds  UE ranger AAROM L shoulder: flexion 10x2 with 5 second holds scaption 10x2 with 5 second holds  Ball wall push-ups 10x2  Standing L shoulder A/AROM with low back against wall: L shoulder flexion 10x2  L shoulder scation 10x2   Standing L biceps curls 4 lbs 10x3  Red T-band triceps extension 10x3  Red T-band bilateral scapular retraction 10x3 with 5 second holds  Improved exercise technique, movement at target joints, use of target muscles after mod verbal, visual, tactile cues.      Improved L shoulder flexion AROM to 130 degrees, L shoulder scaption AROM to 125 degrees. Pt tolerated session well without complain of L shoulder pain. Patient making  progress towards L shoulder AROM goals.                          PT Education - 03/09/16 0901    Education provided Yes   Education Details ther-ex   [employer]) Educated Patient   Methods Explanation;Demonstration;Tactile cues;Verbal cues   Comprehension Verbalized understanding;Returned demonstration             PT Long Term Goals - 02/20/16 1531    PT LONG TERM GOAL #1   Title Patient will have at least 100 degrees L shoulder flexion and scaption PROM to promote ability to raise her L arm when appropriate.    Time 4   Period Weeks    Status Achieved   PT LONG TERM GOAL #2   Title Patient will improve her Quick Dash Disability/Symptom score by at least 10 % as a demonstration of improved function.    Time 4   Period Weeks   Status On-going   PT LONG TERM GOAL #3   Title When appropriate, patient will have at least 100 degrees L shoulder flexion and scaption AAROM against gravity to promote ability to raise her L arm.   Time 4   Period Weeks   Status Achieved   PT LONG TERM GOAL #4   Title Patient will have at least 140 degrees L shoulder flexion AROM and at least 130 degrees L shoulder scaption AROM to promote ability to reach, perform functional tasks such as fixing her hair.    Time 4   Period Weeks   Status New               Plan - 03/09/16 0901    Clinical Impression Statement Improved L shoulder flexion AROM to 130 degrees, L shoulder scaption AROM to 125 degrees. Pt tolerated session well without complain of L shoulder pain. Patient making progress towards L shoulder AROM goals.    Pt will benefit from skilled therapeutic intervention in order to improve on the following deficits Decreased range of motion;Postural dysfunction;Decreased strength;Pain   Rehab Potential Good   Clinical Impairments Affecting Rehab Potential healing time   PT Frequency 2x / week   PT Duration 4 weeks   PT Treatment/Interventions Therapeutic exercise;Therapeutic activities;Manual techniques;Electrical Stimulation;Neuromuscular re-education;Patient/family education;Ultrasound   PT Next Visit Plan soft tissue mobilization, gentle ROM, scapular movement, posture   Consulted and Agree with Plan of Care Patient        Problem List There are no active problems to display for this patient.   Joneen Boers PT, DPT   03/09/2016, 9:44 AM  [place] [facility] [address] [place], [address] Phone: [phone]   Fax:  [phone]  Name: Cassandra Miller MRN:  [id number] Date of Birth: [birth year]

## 2016-03-10 ENCOUNTER — Ambulatory Visit
Admission: RE | Admit: 2016-03-10 | Discharge: 2016-03-10 | Disposition: A | Discharge status: Home / Self Care | Payer: BLUE CROSS/BLUE SHIELD | Source: Ambulatory Visit | Attending: Internal Medicine | Admitting: Internal Medicine

## 2016-03-10 DIAGNOSIS — Z1231 Encounter for screening mammogram for malignant neoplasm of breast: Secondary | ICD-10-CM | POA: Diagnosis not present

## 2016-03-12 ENCOUNTER — Other Ambulatory Visit: Payer: Self-pay | Admitting: Internal Medicine

## 2016-03-12 ENCOUNTER — Ambulatory Visit: Payer: BLUE CROSS/BLUE SHIELD

## 2016-03-12 DIAGNOSIS — R531 Weakness: Secondary | ICD-10-CM

## 2016-03-12 DIAGNOSIS — S42295D Other nondisplaced fracture of upper end of left humerus, subsequent encounter for fracture with routine healing: Secondary | ICD-10-CM

## 2016-03-12 DIAGNOSIS — R928 Other abnormal and inconclusive findings on diagnostic imaging of breast: Secondary | ICD-10-CM

## 2016-03-12 NOTE — Therapy (Signed)
[place] [facility] [address], [place], [address] Phone: [phone]   Fax:  [phone]  Physical Therapy Treatment And Progress Report   Patient Details  Name: Cassandra Miller MRN: [id number] Date of Birth: 12-09-1950 Referring Provider: Damaris Hippo, PA  Encounter Date: 03/12/2016      PT End of Session - 03/12/16 0848    Visit Number 15   Number of Visits 17   Date for PT Re-Evaluation 03/20/16   Authorization Type 7   Authorization Time Period of 10   PT Start Time 0848   PT Stop Time 0932   PT Time Calculation (min) 44 min   Activity Tolerance Patient tolerated treatment well   Behavior During Therapy [facility] for tasks assessed/performed      Past Medical History  Diagnosis Date  . Hypertension     Past Surgical History  Procedure Laterality Date  . Cesarean section      There were no vitals filed for this visit.  Visit Diagnosis:  Other closed nondisplaced fracture of proximal end of left humerus with routine healing, subsequent encounter  Weakness      Subjective Assessment - 03/12/16 0851    Subjective L shoulder feels fine.   Pertinent History L proximal humeral fracture on 12/15/2015 secondary a fall from slipping on ice while walking [place] parking lot at [place].  Wore an immobilizer from 12/15/15 to 01/15/16 until she went to her orthopedic doctor. Currently using a sling. MD stated that her arm is starting to calcify and her next MD appointment is 02/12/16 (at 9:15 am). Per pt, MD told her to keep her L arm in a sling, and not to drive.   Pt also states that her MD said she can type  with her L hand.    Diagnostic tests Per PA note on 02/12/16: "x-rays demonstrate an essentially nondisplaced oblique fracture of the left surgical neck of the humerus with moderate to excellent callus formation"   Patient Stated Goals Pt expresses desire to be able to lift her L arm straight up. Hold her 65 month old  grandson.    Currently in Pain? No/denies   Pain Score 0-No pain   Multiple Pain Sites No      Pt currently 12.6 weeks since injury      [facility] PT Assessment - 03/12/16 1148    Assessment   Next MD Visit 03/16/16   PROM   Left Shoulder Flexion 128 Degrees   Left Shoulder ABduction 125 Degrees  scaption     Objectives      Manual therapy:   Soft tissue mobilization to L teres major and posterior deltoid muscle with L shoulder in end range scaption in supine  128 degrees L shoulder flexion standing AROM, 125 L shoulder scaption standing AROM afterwards.   There-ex  Supine AAROM L shoulder scaption with gentle overpressure to end range to promote ROM  R S/L L shoulder ER 10x3 with 5 second holds  UE ranger AAROM L shoulder: flexion 10x2 with 5 second holds scaption 10x2 with 5 second holds  Standing L biceps curls 4 lbs 10x3  Red T-band triceps extension 10x3  Standing L shoulder A/AROM with low back against wall: L shoulder flexion 10x2  L shoulder scation 10x2  Red T-band bilateral scapular retraction 10x3 with 5 second holds   Improved exercise technique, movement at target joints, use of target muscles after min verbal, visual, tactile cues.  Min  to mod cues to decrease L shoulder shrug, and low back extension/side bending compensation     Pt currently 12.6 weeks since injury. L shoulder standing AROM: flexion 128 degrees, scaption 125 degrees. Stiff end feel. Patient will benefit from continued skilled physical therapy services to continue to improve AROM, ability to raise L arm, and improve function.             PT Education - 03/12/16 0910    Education provided Yes   Education Details ther-ex   [employer]) Educated Patient   Methods Explanation;Demonstration;Tactile cues;Verbal cues    Comprehension Verbalized understanding;Returned demonstration             PT Long Term Goals - 03/12/16 1152    PT LONG TERM GOAL #1   Title Patient will have at least 100 degrees L shoulder flexion and scaption PROM to promote ability to raise her L arm when appropriate.    Time 4   Period Weeks   Status Achieved   PT LONG TERM GOAL #2   Title Patient will improve her Quick Dash Disability/Symptom score by at least 10 % as a demonstration of improved function.    Time 4   Period Weeks   Status On-going   PT LONG TERM GOAL #3   Title When appropriate, patient will have at least 100 degrees L shoulder flexion and scaption AAROM against gravity to promote ability to raise her L arm.   Time 4   Period Weeks   Status Achieved   PT LONG TERM GOAL #4   Title Patient will have at least 140 degrees L shoulder flexion AROM and at least 130 degrees L shoulder scaption AROM to promote ability to reach, perform functional tasks such as fixing her hair.    Time 4   Period Weeks   Status On-going               Plan - 03/12/16 0911    Clinical Impression Statement Pt currently 12.6 weeks since injury. L shoulder standing AROM: flexion 128 degrees, scaption 125 degrees. Stiff end feel. Patient will benefit from continued skilled physical therapy services to continue to improve AROM, ability to raise L arm, and improve function.    Pt will benefit from skilled therapeutic intervention in order to improve on the following deficits Decreased range of motion;Postural dysfunction;Decreased strength;Pain   Rehab Potential Good   Clinical Impairments Affecting Rehab Potential healing time   PT Frequency 2x / week   PT Duration 4 weeks   PT Treatment/Interventions Therapeutic exercise;Therapeutic activities;Manual techniques;Electrical Stimulation;Neuromuscular re-education;Patient/family education;Ultrasound   PT Next Visit Plan soft tissue mobilization, gentle ROM, scapular movement,  posture   Consulted and Agree with Plan of Care Patient        Problem List There are no active problems to display for this patient.  Thank you for your referral.   Joneen Boers PT, DPT   03/12/2016, 11:57 AM  [place] [facility] [address] [place], [address] Phone: [phone]   Fax:  [phone]  Name: Cassandra Miller MRN: [id number] Date of Birth: 19-Jun-1951

## 2016-03-16 ENCOUNTER — Ambulatory Visit: Payer: BLUE CROSS/BLUE SHIELD

## 2016-03-16 DIAGNOSIS — R531 Weakness: Secondary | ICD-10-CM

## 2016-03-16 DIAGNOSIS — S42295D Other nondisplaced fracture of upper end of left humerus, subsequent encounter for fracture with routine healing: Secondary | ICD-10-CM | POA: Diagnosis not present

## 2016-03-16 NOTE — Therapy (Signed)
[place] [facility] [address] [place], [address] Phone: [phone]   Fax:  [phone]  Physical Therapy Treatment  Patient Details  Name: Cassandra Miller MRN: [id number] Date of Birth: 02/25/1951 Referring Provider: Damaris Hippo, PA  Encounter Date: 03/16/2016      PT End of Session - 03/16/16 0802    Visit Number 16   Number of Visits 17   Date for PT Re-Evaluation 03/20/16   Authorization Type 8   Authorization Time Period of 10   PT Start Time 0802   PT Stop Time 0845   PT Time Calculation (min) 43 min   Activity Tolerance Patient tolerated treatment well   Behavior During Therapy [facility] for tasks assessed/performed      Past Medical History  Diagnosis Date  . Hypertension     Past Surgical History  Procedure Laterality Date  . Cesarean section      There were no vitals filed for this visit.      Subjective Assessment - 03/16/16 0804    Subjective L shoulder was pretty good this weekend. Has an appointment with MD today.    Pertinent History L proximal humeral fracture on 12/15/2015 secondary a fall from slipping on ice while walking [place].  Wore an immobilizer from 12/15/15 to 01/15/16 until she went to her orthopedic doctor. Currently using a sling. MD stated that her arm is starting to calcify and her next MD appointment is 02/12/16 (at 9:15 am). Per pt, MD told her to keep her L arm in a sling, and not to drive.   Pt also states that her MD said she can type  with her L hand.    Diagnostic tests Per PA note on 02/12/16: "x-rays demonstrate an essentially nondisplaced oblique fracture of the left surgical neck of the humerus with moderate to excellent callus formation"   Patient Stated Goals Pt expresses desire to be able to lift her L arm straight up. Hold her 6 month old grandson.    Currently in Pain? No/denies   Pain Score 0-No pain              PT Education - 03/16/16 0813     Education provided Yes   Education Details ther-ex   [employer]) Educated Patient   Methods Explanation;Tactile cues;Verbal cues;Demonstration   Comprehension Verbalized understanding;Returned demonstration      Objectives      Manual therapy:   Soft tissue mobilization to L teres major muscle with L shoulder in end range scaption in supine    There-ex  Supine AAROM L shoulder scaption with gentle overpressure to end range to promote ROM 10x3   R S/L L shoulder ER 10x3 with 1 lb weight (upgrade)  Supine L shoulder flexion resisting 1 lb weight 10x3 for strength and ROM (141 degrees L shoulder flexion AROM in supine with exercise)  Ball wall push-ups 10x3  L UT stretch 10 seconds x 5   UE ranger AAROM L shoulder: flexion 10x2 with 5 second holds scaption 10x2 with 5 second holds  Red T-band triceps extension 10x3  Standing L biceps curls 5 lbs 10x3  Red T-band bilateral scapular retraction 10x2 with 5 second holds   Improved exercise technique, movement at target joints, use of target muscles after min to mod verbal, visual, tactile cues.                 PT Long Term Goals -  03/12/16 1152    PT LONG TERM GOAL #1   Title Patient will have at least 100 degrees L shoulder flexion and scaption PROM to promote ability to raise her L arm when appropriate.    Time 4   Period Weeks   Status Achieved   PT LONG TERM GOAL #2   Title Patient will improve her Quick Dash Disability/Symptom score by at least 10 % as a demonstration of improved function.    Time 4   Period Weeks   Status On-going   PT LONG TERM GOAL #3   Title When appropriate, patient will have at least 100 degrees L shoulder flexion and scaption AAROM against gravity to promote ability to raise her L arm.   Time 4   Period Weeks   Status Achieved   PT LONG TERM GOAL #4   Title Patient will have at least 140 degrees L  shoulder flexion AROM and at least 130 degrees L shoulder scaption AROM to promote ability to reach, perform functional tasks such as fixing her hair.    Time 4   Period Weeks   Status On-going               Plan - 03/16/16 0817    Clinical Impression Statement 130 degrees L shoulder flexion AROM standing, L shoulder scaption 123 degrees AROM in standing. 141 degrees L shoulder flexion AROM with 1 lb weight in supine. Patient will benefit from continued skilled physical therapy services to improve AROM, strength throughout range, and ability to raise her L UE and improve ability to perform functional tasks.    Rehab Potential Good   Clinical Impairments Affecting Rehab Potential healing time   PT Frequency 2x / week   PT Duration 4 weeks   PT Treatment/Interventions Therapeutic exercise;Therapeutic activities;Manual techniques;Electrical Stimulation;Neuromuscular re-education;Patient/family education;Ultrasound   PT Next Visit Plan soft tissue mobilization, gentle ROM, scapular movement, posture   Consulted and Agree with Plan of Care Patient      Patient will benefit from skilled therapeutic intervention in order to improve the following deficits and impairments:  Decreased range of motion, Postural dysfunction, Decreased strength, Pain  Visit Diagnosis: Other closed nondisplaced fracture of proximal end of left humerus with routine healing, subsequent encounter  Weakness     Problem List There are no active problems to display for this patient.   Joneen Boers PT, DPT   03/16/2016, 1:30 PM  [place] [facility] [address] [place], [address] Phone: [phone]   Fax:  [phone]  Name: Cassandra Miller MRN: [id number] Date of Birth: 10/25/1951

## 2016-03-17 ENCOUNTER — Ambulatory Visit
Admission: RE | Admit: 2016-03-17 | Discharge: 2016-03-17 | Disposition: A | Discharge status: Home / Self Care | Payer: BLUE CROSS/BLUE SHIELD | Source: Ambulatory Visit | Attending: Internal Medicine | Admitting: Internal Medicine

## 2016-03-17 DIAGNOSIS — N6002 Solitary cyst of left breast: Secondary | ICD-10-CM | POA: Diagnosis not present

## 2016-03-17 DIAGNOSIS — R928 Other abnormal and inconclusive findings on diagnostic imaging of breast: Secondary | ICD-10-CM

## 2016-03-19 ENCOUNTER — Ambulatory Visit: Payer: BLUE CROSS/BLUE SHIELD

## 2016-03-19 DIAGNOSIS — M25612 Stiffness of left shoulder, not elsewhere classified: Secondary | ICD-10-CM

## 2016-03-19 DIAGNOSIS — M6281 Muscle weakness (generalized): Secondary | ICD-10-CM

## 2016-03-19 DIAGNOSIS — S42295D Other nondisplaced fracture of upper end of left humerus, subsequent encounter for fracture with routine healing: Secondary | ICD-10-CM | POA: Diagnosis not present

## 2016-03-19 NOTE — Patient Instructions (Signed)
Internal Rotation (Passive)    Bring hand behind back, using other hand or towel (not shown) to assist. Hold _5___ seconds. Repeat ___10_ times. Do ___3_ sessions per day.  Copyright  V[employer]. All rights reserved.

## 2016-03-19 NOTE — Therapy (Signed)
[facility] [address], [place], [address] Phone: [phone]   Fax:  [phone]  Physical Therapy Treatment  Patient Details  Name: Cassandra Miller MRN: [id number] Date of Birth: 1951-07-01 Referring Provider: Damaris Hippo, PA  Encounter Date: 03/19/2016      PT End of Session - 03/19/16 0803    Visit Number 17   Number of Visits 29   Date for PT Re-Evaluation 04/30/16   Authorization Type 1   Authorization Time Period of 10   PT Start Time 0803   PT Stop Time 0844   PT Time Calculation (min) 41 min   Activity Tolerance Patient tolerated treatment well   Behavior During Therapy [facility] for tasks assessed/performed      Past Medical History  Diagnosis Date  . Hypertension     Past Surgical History  Procedure Laterality Date  . Cesarean section      There were no vitals filed for this visit.      Subjective Assessment - 03/19/16 0804    Subjective L shoulder is doing ok. I just feel like it's regressing instead of getting better. L scapular muscle (terres major) is a little sore. No pain. Pt states that her doctor/PA stated the bone is healed. Doctor wants for her to do more PT for 6 more weeks but if pt does not want to, she does not have to.    Pertinent History L proximal humeral fracture on 12/15/2015 secondary a fall from slipping on ice while walking [place] parking lot at church.  Wore an immobilizer from 12/15/15 to 01/15/16 until she went to her orthopedic doctor. Currently using a sling. MD stated that her arm is starting to calcify and her next MD appointment is 02/12/16 (at 9:15 am). Per pt, MD told her to keep her L arm in a sling, and not to drive.   Pt also states that her MD said she can type  with her L hand.    Diagnostic tests Per PA note on 02/12/16: "x-rays demonstrate an essentially nondisplaced oblique fracture of the left surgical neck of the humerus with moderate to excellent callus  formation"   Patient Stated Goals Pt expresses desire to be able to lift her L arm straight up. Hold her 70 month old grandson.    Currently in Pain? No/denies   Pain Score 0-No pain   Multiple Pain Sites No            [facility] PT Assessment - 03/19/16 0832    Observation/Other Assessments   Quick DASH  18%  measured on 03/09/16   AROM   Left Shoulder Flexion 130 Degrees  after manual therapy   Left Shoulder ABduction 128 Degrees  scaption; after manual therapy   Left Shoulder Internal Rotation 28 Degrees  after manual therapy   Left Shoulder External Rotation 66 Degrees  after manual therapy   Strength   Overall Strength Comments --   Left Shoulder Flexion 4/5   Left Shoulder ABduction 4/5   Left Shoulder Internal Rotation 4/5   Left Shoulder External Rotation 4/5         Objectives   L shoulder flexion AROM 125 degrees, scaption 125 degrees AROM in standing at start of session.    Manual therapy:   Supine lateral and inferior glide to L shoulder joint grade 3+ to 4 at end range scaption   Then grade 3+ to 4 at end range flexion  130 degrees L shoulder flexion AROM, 128 degrees L shoulder scaption afterwards     There-ex  Supine AAROM L shoulder scaption with gentle overpressure to end range to promote ROM 10x3  Supine AAROM L shoulder abduction with gentle overpressure to end range to promote ROM 10x3 Standing L shoulder flexion and scaption AROM multiple times. L shoulder ER and IR AROM 1x each way Manually resisted L shoulder flexion, abduction, ER, IR 1x each way L shoulder IR towel stretch 10x3 with 5 second holds  Reviewed HEP.  Please see pt instructions. Reviewed plan of care  Improved exercise technique, movement at target joints, use of target muscles after min verbal, visual, tactile cues.     Pt demonstrates good improvement with L shoulder flexion and scaption/abduction AROM since first measured on 02/20/16, stiff end  feel. Pt also demonstrates 4/5 L shoulder strength all planes as well as improved Quick Dash score suggesting improved function. Pt still demonstrates limited L shoulder AROM, weakness, and some difficulty using her L UE for functional tasks and would benefit from continued skilled physical therapy services to address the aforementioned deficits.         PT Education - 03/19/16 0841    Education provided Yes   Education Details ther-ex, HEP, plan of care   Person(s) Educated Patient   Methods Explanation;Demonstration;Tactile cues;Verbal cues;Handout   Comprehension Verbalized understanding;Returned demonstration             PT Long Term Goals - 03/19/16 1050    PT LONG TERM GOAL #1   Title Patient will have at least 100 degrees L shoulder flexion and scaption PROM to promote ability to raise her L arm when appropriate.    Time 4   Period Weeks   Status Achieved   PT LONG TERM GOAL #2   Title Patient will improve her Quick Dash Disability/Symptom score by at least 10 % as a demonstration of improved function.    Time 6   Period Weeks   Status Achieved   PT LONG TERM GOAL #3   Title When appropriate, patient will have at least 100 degrees L shoulder flexion and scaption AAROM against gravity to promote ability to raise her L arm.   Time 4   Period Weeks   Status Achieved   PT LONG TERM GOAL #4   Title Patient will have at least 140 degrees L shoulder flexion AROM and at least 130 degrees L shoulder scaption AROM to promote ability to reach, perform functional tasks such as fixing her hair.    Time 6   Period Weeks   Status On-going   PT LONG TERM GOAL #5   Title Pt will have at least 4+/5 L shoulder flexion, scaption/abduction, ER, and IR strength to promote ability to use her L UE for functional tasks.    Baseline 4/5 L shoulder flexion, abduction/scaption, ER, IR   Time 6   Period Weeks   Status New               Plan - 03/19/16 1049    Clinical Impression  Statement Pt demonstrates good improvement with L shoulder flexion and scaption/abduction AROM since first measured on 02/20/16, stiff end feel. Pt also demonstrates 4/5 L shoulder strength all planes as well as improved Quick Dash score suggesting improved function. Pt still demonstrates limited L shoulder AROM, weakness, and some difficulty using her L UE for functional tasks and would benefit from continued skilled physical therapy services to address the  aforementioned deficits.    Rehab Potential Good   Clinical Impairments Affecting Rehab Potential healing time   PT Frequency 2x / week   PT Duration 4 weeks   PT Treatment/Interventions Therapeutic exercise;Therapeutic activities;Manual techniques;Electrical Stimulation;Neuromuscular re-education;Patient/family education;Ultrasound   PT Next Visit Plan soft tissue mobilization, gentle ROM, scapular movement, posture   Consulted and Agree with Plan of Care Patient      Patient will benefit from skilled therapeutic intervention in order to improve the following deficits and impairments:  Decreased range of motion, Postural dysfunction, Decreased strength, Pain  Visit Diagnosis: Stiffness of left shoulder, not elsewhere classified - Plan: PT plan of care cert/re-cert, PT plan of care cert/re-cert  Muscle weakness (generalized) - Plan: PT plan of care cert/re-cert, PT plan of care cert/re-cert       G-Codes - 04/16/16 1057    Functional Assessment Tool Used patient interview, clinical presentation, clinical judgement, Quick Dash   Functional Limitation Carrying, moving and handling objects   Carrying, Moving and Handling Objects Current Status [id number]) At least 1 percent but less than 20 percent impaired, limited or restricted   Carrying, Moving and Handling Objects Goal Status [id number]) 0 percent impaired, limited or restricted  upgrade; pt demonstrates potential for improvement      Problem List There are no active problems to display  for this patient.  Thank you for your referral.  Joneen Boers PT, DPT   2016/04/16, 8:21 PM  [facility] [address] [place], [address] Phone: [phone]   Fax:  [phone]  Name: Trese Jaegers Emrich MRN: [id number] Date of Birth: 1951-01-20

## 2016-04-07 ENCOUNTER — Ambulatory Visit: Payer: BLUE CROSS/BLUE SHIELD | Attending: Student

## 2016-04-07 DIAGNOSIS — M6281 Muscle weakness (generalized): Secondary | ICD-10-CM | POA: Diagnosis present

## 2016-04-07 DIAGNOSIS — M25612 Stiffness of left shoulder, not elsewhere classified: Secondary | ICD-10-CM | POA: Insufficient documentation

## 2016-04-07 NOTE — Therapy (Signed)
[facility] [address] [place], [address] Phone: [phone]   Fax:  [phone]  Physical Therapy Treatment  Patient Details  Name: Cassandra Miller MRN: [id number] Date of Birth: 30-Nov-1951 Referring Provider: Damaris Hippo, PA  Encounter Date: 04/07/2016      PT End of Session - 04/07/16 0842    Visit Number 18   Number of Visits 29   Date for PT Re-Evaluation 04/28/16   Authorization Type 2   Authorization Time Period of 10   PT Start Time 0804   PT Stop Time 0842   PT Time Calculation (min) 38 min   Activity Tolerance Patient tolerated treatment well   Behavior During Therapy [facility] for tasks assessed/performed      Past Medical History  Diagnosis Date  . Hypertension     Past Surgical History  Procedure Laterality Date  . Cesarean section      There were no vitals filed for this visit.      Subjective Assessment - 04/07/16 0806    Subjective Pt reports L shoulder is doing much better. She was even able to pick up her grandson recently and hook her bra strap in the back. She feels that she is almost ready to complete PT and continue on her own.   Pertinent History L proximal humeral fracture on 12/15/2015 secondary a fall from slipping on ice while walking [place] parking lot at [place].  Wore an immobilizer from 12/15/15 to 01/15/16 until she went to her orthopedic doctor. Currently using a sling. MD stated that her arm is starting to calcify and her next MD appointment is 02/12/16 (at 9:15 am). Per pt, MD told her to keep her L arm in a sling, and not to drive.   Pt also states that her MD said she can type  with her L hand.    Diagnostic tests Per PA note on 02/12/16: "x-rays demonstrate an essentially nondisplaced oblique fracture of the left surgical neck of the humerus with moderate to excellent callus formation"   Patient Stated Goals Pt expresses desire to be able to lift her L arm straight up. Hold her 80  month old grandson.    Currently in Pain? No/denies     Objective:  Rechecked goals:  Quick Dash 11%   L shoulder flexion AROM 148 degrees, scaption 135 degrees AROM in standing at start of session.     Manual therapy:   Supine lateral and inferior glide to L shoulder joint grade 3+ to 4 at end range scaption               Then grade 3+ to 4 at end range flexion  L shoulder flexion 155 degrees and scaption 143 degrees afterwards                            There-ex  Supine AAROM L shoulder scaption with gentle overpressure to end range to promote ROM 10x3   Supine AAROM L shoulder abduction with gentle overpressure to end range to promote ROM 10x3 L shoulder IR towel stretch 10x3 with 5 second holds with modified technique of towel over R shoulder for increased stretch and ROM Doorway pec stretch L shoulder 3x20 sec Wall flexion stretch L shoulder 3x20 sec              Reviewed HEP. Pt feels comfortable with exercises. Reviewed plan of care: pt  feels she is almost where she wants to be to complete therapy and continue with HEP.                              PT Education - 04/07/16 [phone]    Education provided Yes   Education Details progression of behind the back IR stretch with towel over opposite shoulder   Person(s) Educated Patient   Methods Explanation;Demonstration   Comprehension Verbalized understanding;Returned demonstration             PT Long Term Goals - 04/07/16 0810    PT LONG TERM GOAL #1   Title Patient will have at least 100 degrees L shoulder flexion and scaption PROM to promote ability to raise her L arm when appropriate.    Time 4   Period Weeks   Status Achieved   PT LONG TERM GOAL #2   Title Patient will improve her Quick Dash Disability/Symptom score by at least 10 % as a demonstration of improved function.    Time 6   Period Weeks   Status Achieved   PT LONG TERM GOAL #3   Title When appropriate, patient will  have at least 100 degrees L shoulder flexion and scaption AAROM against gravity to promote ability to raise her L arm.   Time 4   Period Weeks   Status Achieved   PT LONG TERM GOAL #4   Title Patient will have at least 140 degrees L shoulder flexion AROM and at least 130 degrees L shoulder scaption AROM to promote ability to reach, perform functional tasks such as fixing her hair.    Time 6   Period Weeks   Status Achieved   PT LONG TERM GOAL #5   Title Pt will have at least 4+/5 L shoulder flexion, scaption/abduction, ER, and IR strength to promote ability to use her L UE for functional tasks.    Baseline 4/5 L shoulder flexion, abduction/scaption, ER, IR   Time 6   Period Weeks   Status On-going               Plan - 04/07/16 [id number]    Clinical Impression Statement Pt continues to improve in ROM and strength of L shoulder. Today her AROM flexion was 155 and scaption 143. Strength was 4+/5 L shoulder flexion and scaption, 4/5 IR/ER. Quick Dash 11%. She will benefit from continued skilled PT to continue joint mobilization, stretching and strengthening to further progress towards PLOF.   Rehab Potential Good   Clinical Impairments Affecting Rehab Potential healing time   PT Frequency 2x / week   PT Duration 3 weeks   PT Treatment/Interventions Therapeutic exercise;Therapeutic activities;Manual techniques;Electrical Stimulation;Neuromuscular re-education;Patient/family education;Ultrasound   PT Next Visit Plan soft tissue mobilization, gentle ROM, scapular movement, posture   PT Home Exercise Plan continue stretching exercises   Consulted and Agree with Plan of Care Patient      Patient will benefit from skilled therapeutic intervention in order to improve the following deficits and impairments:  Decreased range of motion, Postural dysfunction, Decreased strength, Pain  Visit Diagnosis: Muscle weakness (generalized) - Plan: PT plan of care cert/re-cert  Stiffness of left  shoulder, not elsewhere classified - Plan: PT plan of care cert/re-cert     Problem List There are no active problems to display for this patient.   Cassandra Miller, PT, DPT  04/07/2016, 9:02 AM [facility] [address] [place], [address] Phone: [phone]   Fax:  [phone]  Name: Cassandra Miller MRN: [id number] Date of Birth: [birth year]

## 2016-04-09 ENCOUNTER — Ambulatory Visit: Payer: BLUE CROSS/BLUE SHIELD

## 2016-04-14 ENCOUNTER — Ambulatory Visit: Payer: BLUE CROSS/BLUE SHIELD

## 2016-04-14 DIAGNOSIS — M25612 Stiffness of left shoulder, not elsewhere classified: Secondary | ICD-10-CM

## 2016-04-14 DIAGNOSIS — M6281 Muscle weakness (generalized): Secondary | ICD-10-CM

## 2016-04-14 NOTE — Patient Instructions (Signed)
.    Resisted External Rotation: in Neutral - Bilateral    Sit or stand, tubing in both hands, elbows at sides, bent to 90, forearms forward. Pinch shoulder blades together and rotate forearms out. Keep elbows at sides. Hold for 5 seconds.  Repeat _10___ times per set. Do ___3_ sets per session. Do __1__ sessions per day.  http://orth.exer.us/967   Copyright  VHI. All rights reserved

## 2016-04-14 NOTE — Therapy (Signed)
[facility] [address] [place], [address] Phone: [phone]   Fax:  [phone]  Physical Therapy Treatment  Patient Details  Name: Cassandra Miller MRN: [id number] Date of Birth: Apr 23, 1951 Referring Provider: Damaris Hippo, PA  Encounter Date: 04/14/2016      PT End of Session - 04/14/16 0802    Visit Number 19   Number of Visits 29   Date for PT Re-Evaluation 04/28/16   Authorization Type 3   Authorization Time Period of 10   PT Start Time 0803   PT Stop Time 0853   PT Time Calculation (min) 50 min   Activity Tolerance Patient tolerated treatment well   Behavior During Therapy [facility] for tasks assessed/performed      Past Medical History  Diagnosis Date  . Hypertension     Past Surgical History  Procedure Laterality Date  . Cesarean section      There were no vitals filed for this visit.      Subjective Assessment - 04/14/16 0806    Subjective L shoulder feels pretty good. It was "Cricking" this morning when raising her L arm up on her back holding a 5 lb weight. Was not able to do exercises last week due to one of her grandsons was in the hospital (better now). No pain currently. The last shoulder measurements were in supine.    Pertinent History L proximal humeral fracture on 12/15/2015 secondary a fall from slipping on ice while walking [place] parking lot at [place].  Wore an immobilizer from 12/15/15 to 01/15/16 until she went to her orthopedic doctor. Currently using a sling. MD stated that her arm is starting to calcify and her next MD appointment is 02/12/16 (at 9:15 am). Per pt, MD told her to keep her L arm in a sling, and not to drive.   Pt also states that her MD said she can type  with her L hand.    Diagnostic tests Per PA note on 02/12/16: "x-rays demonstrate an essentially nondisplaced oblique fracture of the left surgical neck of the humerus with moderate to excellent callus formation"   Patient  Stated Goals Pt expresses desire to be able to lift her L arm straight up. Hold her 48 month old grandson.    Currently in Pain? Yes   Pain Score 0-No pain           Objectives   L shoulder flexion AROM 140 degrees, scaption 123 degrees AROM in standing at start of session.    Manual therapy:   Supine lateral and inferior glide to L shoulder joint grade 3+ to 4 as well as posterior lateral glide L shoulder joint grade 3+ at end range scaption    Supine STM to L terres major  140 degrees L shoulder flexion AROM, 134 degrees L shoulder scaption afterwards     There-ex  Supine AAROM L shoulder scaption with gentle overpressure to end range to promote ROM 10x3  Supine AAROM L shoulder abduction with gentle overpressure to end range to promote ROM 10x3 Standing L shoulder flexion and scaption AROM multiple times. L pectoralis stretch at doorway 30 seconds x 3 Standing T-band (green) bilateral shoulder extension with scapular retraction 10x5 seconds for 2 sets Standing bilateral shoulder ER resisting yellow band 10x3 with 5 second holds  Reviewed and given as part of her HEP. Pt demonstrated and verbalized understanding.  Standing L triceps extension resisting green band 10x2  Improved exercise technique, movement at target joints, use of target muscles after mod verbal, visual, tactile cues.     Improved L shoulder AROM flexion and scaption after joint mobilizations, STM to L terres major and exercises. Pt still demonstrates stiffness and would benefit from skilled physical therapy services to promote AROM to improve ability to perform functional tasks.             PT Education - 04/14/16 0854    Education provided Yes   Education Details ther-ex, HEP   Person(s) Educated Patient   Methods Explanation;Demonstration;Tactile cues;Verbal cues;Handout   Comprehension Verbalized understanding;Returned demonstration              PT Long Term Goals - 04/07/16 0810    PT LONG TERM GOAL #1   Title Patient will have at least 100 degrees L shoulder flexion and scaption PROM to promote ability to raise her L arm when appropriate.    Time 4   Period Weeks   Status Achieved   PT LONG TERM GOAL #2   Title Patient will improve her Quick Dash Disability/Symptom score by at least 10 % as a demonstration of improved function.    Time 6   Period Weeks   Status Achieved   PT LONG TERM GOAL #3   Title When appropriate, patient will have at least 100 degrees L shoulder flexion and scaption AAROM against gravity to promote ability to raise her L arm.   Time 4   Period Weeks   Status Achieved   PT LONG TERM GOAL #4   Title Patient will have at least 140 degrees L shoulder flexion AROM and at least 130 degrees L shoulder scaption AROM to promote ability to reach, perform functional tasks such as fixing her hair.    Time 6   Period Weeks   Status Achieved   PT LONG TERM GOAL #5   Title Pt will have at least 4+/5 L shoulder flexion, scaption/abduction, ER, and IR strength to promote ability to use her L UE for functional tasks.    Baseline 4/5 L shoulder flexion, abduction/scaption, ER, IR   Time 6   Period Weeks   Status On-going               Plan - 04/14/16 [id number]    Clinical Impression Statement Improved L shoulder AROM flexion and scaption after joint mobilizations, STM to L terres major and exercises. Pt still demonstrates stiffness and would benefit from skilled physical therapy services to promote AROM to improve ability to perform functional tasks.     Rehab Potential Good   Clinical Impairments Affecting Rehab Potential healing time   PT Frequency 2x / week   PT Duration 3 weeks   PT Treatment/Interventions Therapeutic exercise;Therapeutic activities;Manual techniques;Electrical Stimulation;Neuromuscular re-education;Patient/family education;Ultrasound   PT Next Visit Plan soft tissue mobilization, gentle  ROM, scapular movement, posture   PT Home Exercise Plan continue stretching exercises   Consulted and Agree with Plan of Care Patient      Patient will benefit from skilled therapeutic intervention in order to improve the following deficits and impairments:  Decreased range of motion, Postural dysfunction, Decreased strength, Pain  Visit Diagnosis: Muscle weakness (generalized)  Stiffness of left shoulder, not elsewhere classified     Problem List There are no active problems to display for this patient.   Joneen Boers PT, DPT   04/14/2016, 3:50 PM  [facility] [address]  [place], [place], [address] Phone: [phone]   Fax:  [phone]  Name: Adilyn Paty Lastra MRN: [id number] Date of Birth: 21-Sep-1951

## 2016-04-16 ENCOUNTER — Ambulatory Visit: Payer: BLUE CROSS/BLUE SHIELD

## 2016-04-16 DIAGNOSIS — M25612 Stiffness of left shoulder, not elsewhere classified: Secondary | ICD-10-CM

## 2016-04-16 DIAGNOSIS — M6281 Muscle weakness (generalized): Secondary | ICD-10-CM

## 2016-04-16 NOTE — Patient Instructions (Addendum)
Gave L shoulder extension resisting red band with assisted L shoulder flexion 10x3 daily as part of her HEP. Pt demonstrated and verbalized understanding.   Recommended for pt to hold off on her yellow T-band ER exercise to see if her shoulder feels better.

## 2016-04-16 NOTE — Therapy (Signed)
[place] [facility] [address], [place], [address] Phone: [phone]   Fax:  [phone]  Physical Therapy Treatment  Patient Details  Name: Cassandra Miller MRN: [id number] Date of Birth: 1950-12-13 Referring Provider: Damaris Hippo, PA  Encounter Date: 04/16/2016      PT End of Session - 04/16/16 0802    Visit Number 20   Number of Visits 29   Date for PT Re-Evaluation 04/28/16   Authorization Type 4   Authorization Time Period of 10   PT Start Time 0802   PT Stop Time 0846   PT Time Calculation (min) 44 min   Activity Tolerance Patient tolerated treatment well   Behavior During Therapy [facility] for tasks assessed/performed      Past Medical History  Diagnosis Date  . Hypertension     Past Surgical History  Procedure Laterality Date  . Cesarean section      There were no vitals filed for this visit.      Subjective Assessment - 04/16/16 0803    Subjective Pt states feeling tightness and an ache L inferior lateral shouler (terres major/infraspinatus area), increases the more she lowers her arm after raising it (2.5/10). Symptoms started this week.    Pertinent History L proximal humeral fracture on 12/15/2015 secondary a fall from slipping on ice while walking [place] the parking lot at church.  Wore an immobilizer from 12/15/15 to 01/15/16 until she went to her orthopedic doctor. Currently using a sling. MD stated that her arm is starting to calcify and her next MD appointment is 02/12/16 (at 9:15 am). Per pt, MD told her to keep her L arm in a sling, and not to drive.   Pt also states that her MD said she can type  with her L hand.    Diagnostic tests Per PA note on 02/12/16: "x-rays demonstrate an essentially nondisplaced oblique fracture of the left surgical neck of the humerus with moderate to excellent callus formation"   Patient Stated Goals Pt expresses desire to be able to lift her L arm straight up. Hold her 44 month  old grandson.    Currently in Pain? Yes   Pain Score 3   teres major area ache 2.5/10    Multiple Pain Sites No           Objectives  There-ex  Directed patient with standing L shoulder extension with assisted shoulder flexion using red theraband 10x3   Decreased L arm symptoms. Reviewed and given as part of HEP 10x3 daily  Prone L shoulder scaption 10x2 L S/L sleeper stretch to promote L shoulder IR ROM 3x5 with 5 second holds Supine AAROM L shoulder scaption with gentle overpressure to end range to promote ROM 10x3  L shoulder flexion AROM multiple times in standing  Improved exercise technique, movement at target joints, use of target muscles after min to mod verbal, visual, tactile cues.    Manual therapy   STM L terres major and proximal triceps area   Decreased L shoulder symptoms to 0.5/10 when lowering from flexion.    Decreased L shoulder pain/discomfort after STM to teres major and proximal L triceps area as well as after performing exercises. Patient will benefit from continued skilled physical therapy services to promote ability to raise her L arm, and reach without pain.          PT Education - 04/16/16 0808    Education provided Yes   Education Details  ther-ex   Person(s) Educated Patient   Methods Explanation;Demonstration;Tactile cues;Verbal cues   Comprehension Verbalized understanding;Returned demonstration             PT Long Term Goals - 04/07/16 0810    PT LONG TERM GOAL #1   Title Patient will have at least 100 degrees L shoulder flexion and scaption PROM to promote ability to raise her L arm when appropriate.    Time 4   Period Weeks   Status Achieved   PT LONG TERM GOAL #2   Title Patient will improve her Quick Dash Disability/Symptom score by at least 10 % as a demonstration of improved function.    Time 6   Period Weeks   Status Achieved   PT LONG TERM GOAL #3   Title When appropriate, patient will have at least 100 degrees L  shoulder flexion and scaption AAROM against gravity to promote ability to raise her L arm.   Time 4   Period Weeks   Status Achieved   PT LONG TERM GOAL #4   Title Patient will have at least 140 degrees L shoulder flexion AROM and at least 130 degrees L shoulder scaption AROM to promote ability to reach, perform functional tasks such as fixing her hair.    Time 6   Period Weeks   Status Achieved   PT LONG TERM GOAL #5   Title Pt will have at least 4+/5 L shoulder flexion, scaption/abduction, ER, and IR strength to promote ability to use her L UE for functional tasks.    Baseline 4/5 L shoulder flexion, abduction/scaption, ER, IR   Time 6   Period Weeks   Status On-going               Plan - 04/16/16 0809    Clinical Impression Statement Decreased L shoulder pain/discomfort after STM to teres major and proximal L triceps area as well as after performing exercises. Patient will benefit from continued skilled physical therapy services to promote ability to raise her L arm, and reach without pain.    Rehab Potential Good   Clinical Impairments Affecting Rehab Potential healing time   PT Frequency 2x / week   PT Duration 3 weeks   PT Treatment/Interventions Therapeutic exercise;Therapeutic activities;Manual techniques;Electrical Stimulation;Neuromuscular re-education;Patient/family education;Ultrasound   PT Next Visit Plan soft tissue mobilization, gentle ROM, scapular movement, posture   PT Home Exercise Plan continue stretching exercises   Consulted and Agree with Plan of Care Patient      Patient will benefit from skilled therapeutic intervention in order to improve the following deficits and impairments:  Decreased range of motion, Postural dysfunction, Decreased strength, Pain  Visit Diagnosis: Muscle weakness (generalized)  Stiffness of left shoulder, not elsewhere classified     Problem List There are no active problems to display for this patient.   Joneen Boers  PT, DPT   04/16/2016, 8:27 PM  [place] [facility] [address], [place], [address] Phone: [phone]   Fax:  [phone]  Name: Cassandra Miller MRN: [id number] Date of Birth: 07-07-1951

## 2016-04-21 ENCOUNTER — Ambulatory Visit: Payer: BLUE CROSS/BLUE SHIELD

## 2016-04-23 ENCOUNTER — Ambulatory Visit: Payer: BLUE CROSS/BLUE SHIELD

## 2016-04-23 DIAGNOSIS — M6281 Muscle weakness (generalized): Secondary | ICD-10-CM | POA: Diagnosis not present

## 2016-04-23 DIAGNOSIS — M25612 Stiffness of left shoulder, not elsewhere classified: Secondary | ICD-10-CM

## 2016-04-23 NOTE — Therapy (Signed)
[place] [facility] [address], [place], [address] Phone: [phone]   Fax:  [phone]  Physical Therapy Treatment  Patient Details  Name: Cassandra Miller MRN: [id number] Date of Birth: 1951/10/24 Referring Provider: Damaris Hippo, PA  Encounter Date: 04/23/2016      PT End of Session - 04/23/16 0803    Visit Number 21   Number of Visits 29   Date for PT Re-Evaluation 04/28/16   Authorization Type 5   Authorization Time Period of 10   PT Start Time 0803   PT Stop Time 0851   PT Time Calculation (min) 48 min   Activity Tolerance Patient tolerated treatment well   Behavior During Therapy [facility] for tasks assessed/performed      Past Medical History  Diagnosis Date  . Hypertension     Past Surgical History  Procedure Laterality Date  . Cesarean section      There were no vitals filed for this visit.      Subjective Assessment - 04/23/16 0805    Subjective Pt states stopping the vitamin C supplement. L shoulder feels fine. A little achy at the top of the shoulder.    Pertinent History L proximal humeral fracture on 12/15/2015 secondary a fall from slipping on ice while walking [place] the parking lot at church.  Wore an immobilizer from 12/15/15 to 01/15/16 until she went to her orthopedic doctor. Currently using a sling. MD stated that her arm is starting to calcify and her next MD appointment is 02/12/16 (at 9:15 am). Per pt, MD told her to keep her L arm in a sling, and not to drive.   Pt also states that her MD said she can type  with her L hand.    Diagnostic tests Per PA note on 02/12/16: "x-rays demonstrate an essentially nondisplaced oblique fracture of the left surgical neck of the humerus with moderate to excellent callus formation"   Patient Stated Goals Pt expresses desire to be able to lift her L arm straight up. Hold her 28 month old grandson.    Currently in Pain? Other (Comment)  ache, no complain of pain.    Pain Score 0-No pain     The posterior discomfort at L shoulder is doing pretty good.        [facility] PT Assessment - 04/23/16 1257    AROM   Left Shoulder Flexion 145 Degrees  after manual therapy   Left Shoulder ABduction 143 Degrees  scaption; after manual therapy       Objectives   L shoulder flexion AROM 143, L shoulder scaption 140 degrees at start of session   Manual therapy:  Supine inferior lateral, and posterior lateral glide L shoulder joint at end range scaption  Supine STM L terres major at end range scaption   145 degrees L shoulder flexion, 143 degrees L shoulder scaption    There-ex  Directed patient with L S/L sleeper stretch to promote L shoulder IR ROM 10x2 with 5 second holds Supine AAROM L shoulder scaption with gentle overpressure to end range to promote ROM 10x3  Supine AAROM L shoulder flexion with gentle overpressure to end range to promote ROM 10x3 Prone L shoulder scaption 10x2 Standing L shoulder extension with assisted shoulder flexion using red theraband 6x, then 10x to promote flexion AROM    Improved exercise technique, movement at target joints, use of target muscles after min to mod verbal, visual, tactile cues.  145 degrees L shoulder flexion, 143 degrees L shoulder scaption AROM after manual therapy.                    PT Education - 04/23/16 1250    Education provided Yes   Education Details ther-ex   [employer]) Educated Patient   Methods Explanation;Demonstration;Tactile cues;Verbal cues   Comprehension Verbalized understanding;Returned demonstration             PT Long Term Goals - 04/07/16 0810    PT LONG TERM GOAL #1   Title Patient will have at least 100 degrees L shoulder flexion and scaption PROM to promote ability to raise her L arm when appropriate.    Time 4   Period Weeks   Status Achieved   PT LONG TERM GOAL #2   Title Patient will improve her Quick Dash Disability/Symptom score by  at least 10 % as a demonstration of improved function.    Time 6   Period Weeks   Status Achieved   PT LONG TERM GOAL #3   Title When appropriate, patient will have at least 100 degrees L shoulder flexion and scaption AAROM against gravity to promote ability to raise her L arm.   Time 4   Period Weeks   Status Achieved   PT LONG TERM GOAL #4   Title Patient will have at least 140 degrees L shoulder flexion AROM and at least 130 degrees L shoulder scaption AROM to promote ability to reach, perform functional tasks such as fixing her hair.    Time 6   Period Weeks   Status Achieved   PT LONG TERM GOAL #5   Title Pt will have at least 4+/5 L shoulder flexion, scaption/abduction, ER, and IR strength to promote ability to use her L UE for functional tasks.    Baseline 4/5 L shoulder flexion, abduction/scaption, ER, IR   Time 6   Period Weeks   Status On-going               Plan - 04/23/16 1251    Clinical Impression Statement  145 degrees L shoulder flexion, 143 degrees L shoulder scaption AROM after manual therapy.    Rehab Potential Good   Clinical Impairments Affecting Rehab Potential healing time   PT Frequency 2x / week   PT Duration 3 weeks   PT Treatment/Interventions Therapeutic exercise;Therapeutic activities;Manual techniques;Electrical Stimulation;Neuromuscular re-education;Patient/family education;Ultrasound   PT Next Visit Plan soft tissue mobilization, gentle ROM, scapular movement, posture   PT Home Exercise Plan continue stretching exercises   Consulted and Agree with Plan of Care Patient      Patient will benefit from skilled therapeutic intervention in order to improve the following deficits and impairments:  Decreased range of motion, Postural dysfunction, Decreased strength, Pain  Visit Diagnosis: Muscle weakness (generalized)  Stiffness of left shoulder, not elsewhere classified     Problem List There are no active problems to display for this  patient.   Joneen Boers PT, DPT   04/23/2016, 1:02 PM  [place] [facility] [address] [place], [address] Phone: [phone]   Fax:  [phone]  Name: Cassandra Miller MRN: [id number] Date of Birth: [birth year]

## 2016-04-28 ENCOUNTER — Ambulatory Visit: Payer: BLUE CROSS/BLUE SHIELD

## 2016-04-28 DIAGNOSIS — M25612 Stiffness of left shoulder, not elsewhere classified: Secondary | ICD-10-CM

## 2016-04-28 DIAGNOSIS — M6281 Muscle weakness (generalized): Secondary | ICD-10-CM | POA: Diagnosis not present

## 2016-04-28 NOTE — Therapy (Signed)
[facility] [facility] [address], [place], [address] Phone: [phone]   Fax:  [phone]  Physical Therapy Treatment And Progress Report  Patient Details  Name: Cassandra Miller MRN: [id number] Date of Birth: Jun 28, 1951 Referring Provider: Blanchard Mane, PA  Encounter Date: 04/28/2016      PT End of Session - 04/28/16 0804    Visit Number 22   Number of Visits 37   Date for PT Re-Evaluation 05/28/16   Authorization Type 6   Authorization Time Period of 10   PT Start Time 0804   PT Stop Time 0859   PT Time Calculation (min) 55 min   Activity Tolerance Patient tolerated treatment well   Behavior During Therapy [facility] for tasks assessed/performed      Past Medical History  Diagnosis Date  . Hypertension     Past Surgical History  Procedure Laterality Date  . Cesarean section      There were no vitals filed for this visit.      Subjective Assessment - 04/28/16 0806    Subjective L shoulder feels fine. No pain.    Pertinent History L proximal humeral fracture on 12/15/2015 secondary a fall from slipping on ice while walking [place] parking lot [place].  Wore an immobilizer from 12/15/15 to 01/15/16 until she went to her orthopedic doctor. Currently using a sling. MD stated that her arm is starting to calcify and her next MD appointment is 02/12/16 (at 9:15 am). Per pt, MD told her to keep her L arm in a sling, and not to drive.   Pt also states that her MD said she can type  with her L hand.    Diagnostic tests Per PA note on 02/12/16: "x-rays demonstrate an essentially nondisplaced oblique fracture of the left surgical neck of the humerus with moderate to excellent callus formation"   Patient Stated Goals Pt expresses desire to be able to lift her L arm straight up. Hold her 47 month old grandson.    Currently in Pain? No/denies            [facility] PT Assessment - 04/28/16 0849    AROM   Left Shoulder Flexion 148  Degrees  after manual therapy   Left Shoulder ABduction 153 Degrees  after manual therapy   Left Shoulder Internal Rotation 52 Degrees  after manual therapy   Left Shoulder External Rotation 90 Degrees  after manual therapy   Strength   Left Shoulder Flexion 4+/5   Left Shoulder ABduction 4+/5   Left Shoulder Internal Rotation 4/5   Left Shoulder External Rotation 4+/5         Objectives   L shoulder flexion AROM 147 (back against the wall), 140 degrees scaption (back against the wall) at start of session   There-ex  Directed patient with cross arm stretch 15 seconds x 5 L shoulder Towel IR stretch 15 seconds x 5 L shoulder S/L L shoulder IR/ sleeper stretch 3x10 with 5 second holds   Reviewed and given as part of her HEP. Pt demonstrated and verbalized understanding.  L shoulder weight shifting in table push-up position 10x3 with 5 seconds L shoulder flexion and scaption AROM multiple times Supine L shoulder ER, and IR 1-2x each way Standing manually resisted L shoulder flexion, abduction, ER, and IR 1-2x each way. Reviewed progress/current status with L shoulder AROM and strength with patient as well as plan of care.   Added standing L  shoulder extension with assisted shoulder scaption using red theraband 10x3 with 5 second holds to her HEP to promote scaption AROM  Pt verbalized understanding.    Improved exercise technique, movement at target joints, use of target muscles after min to mod verbal, visual, tactile cues.      Manual therapy:  Supine inferior lateral, and posterior lateral glide L shoulder joint at end range scaption and flexion grade 4 to 4+  148 degrees flexion AROM, 153 degrees L shoulder scaption AROM after manual therapy.                          PT Education - 04/28/16 8756    Education provided Yes   Education Details ther-ex, HEP   Person(s) Educated Patient   Methods Explanation;Demonstration;Tactile cues;Verbal cues    Comprehension Verbalized understanding;Returned demonstration             PT Long Term Goals - 04/28/16 0906    PT LONG TERM GOAL #1   Title Patient will have at least 100 degrees L shoulder flexion and scaption PROM to promote ability to raise her L arm when appropriate.    Time 4   Period Weeks   Status Achieved   PT LONG TERM GOAL #2   Title Patient will improve her Quick Dash Disability/Symptom score by at least 10 % as a demonstration of improved function.    Time 6   Period Weeks   Status Achieved   PT LONG TERM GOAL #3   Title When appropriate, patient will have at least 100 degrees L shoulder flexion and scaption AAROM against gravity to promote ability to raise her L arm.   Time 4   Period Weeks   Status Achieved   PT LONG TERM GOAL #4   Title Patient will have at least 140 degrees L shoulder flexion AROM and at least 130 degrees L shoulder scaption AROM to promote ability to reach, perform functional tasks such as fixing her hair.    Time 6   Period Weeks   Status Achieved   PT LONG TERM GOAL #5   Title Pt will have at least 4+/5 L shoulder flexion, scaption/abduction, ER, and IR strength to promote ability to use her L UE for functional tasks.    Baseline 4/5 L shoulder flexion, abduction/scaption, ER, IR   Time 6   Period Weeks   Status Partially Met   Additional Long Term Goals   Additional Long Term Goals Yes   PT LONG TERM GOAL #6   Title Patient will have at least 155 degrees L shoulder flexion and scaption AROM to promote ability to  raise her L arm and reach.    Time 4   Period Weeks   Status New               Plan - 04/28/16 4332    Clinical Impression Statement Pt has improved L shoulder flexion, scaption, IR, ER AROM, and overall strength. Pt still demonstrates limitations with AROM but improving, as well as stiffness at her posterior and inferior portions of her glenohumeral joint. Patient will benefit from skilled physical therapy  services to address the aforementioned deficits to promote ability to raise her L arm, reach, and utilize it for functional tasks.    Rehab Potential Good   Clinical Impairments Affecting Rehab Potential healing time   PT Frequency 2x / week   PT Duration 3 weeks   PT Treatment/Interventions Therapeutic  exercise;Therapeutic activities;Manual techniques;Electrical Stimulation;Neuromuscular re-education;Patient/family education;Ultrasound   PT Next Visit Plan soft tissue mobilization, gentle ROM, scapular movement, posture   PT Home Exercise Plan continue stretching exercises   Consulted and Agree with Plan of Care Patient      Patient will benefit from skilled therapeutic intervention in order to improve the following deficits and impairments:  Decreased range of motion, Postural dysfunction, Decreased strength, Pain  Visit Diagnosis: Stiffness of left shoulder, not elsewhere classified - Plan: PT plan of care cert/re-cert  Muscle weakness (generalized) - Plan: PT plan of care cert/re-cert     Problem List There are no active problems to display for this patient.  Thank you for your referral.  Joneen Boers PT, DPT   04/28/2016, 9:26 AM  [place] [facility] [address], [place], [address] Phone: [phone]   Fax:  [phone]  Name: Mikalah Skyles Swatzell MRN: [id number] Date of Birth: [birth year]

## 2016-04-28 NOTE — Patient Instructions (Addendum)
Added standing L shoulder extension with assisted shoulder scaption using red theraband 10x3 with 5 second holds to her HEP to promote scaption AROM  Pt verbalized understanding.   Added S/L L shoulder IR/ sleeper stretch 3x10 with 5 second holds to her HEP.   Reviewed and given as part of her HEP. Pt demonstrated and verbalized understanding.

## 2016-04-30 ENCOUNTER — Ambulatory Visit: Payer: BLUE CROSS/BLUE SHIELD

## 2016-04-30 DIAGNOSIS — M6281 Muscle weakness (generalized): Secondary | ICD-10-CM

## 2016-04-30 DIAGNOSIS — M25612 Stiffness of left shoulder, not elsewhere classified: Secondary | ICD-10-CM

## 2016-04-30 NOTE — Therapy (Signed)
[place] [facility] [address] [place], [address] Phone: [phone]   Fax:  [phone]  Physical Therapy Treatment  Patient Details  Name: Sylvania Moss Kimmer MRN: [id number] Date of Birth: Apr 23, 1951 Referring Provider: Damaris Hippo, PA  Encounter Date: 04/30/2016      PT End of Session - 04/30/16 1300    Visit Number 23   Number of Visits 37   Date for PT Re-Evaluation 05/28/16   Authorization Type 7   Authorization Time Period of 10   PT Start Time 1300   PT Stop Time 1344   PT Time Calculation (min) 44 min   Activity Tolerance Patient tolerated treatment well   Behavior During Therapy [facility] for tasks assessed/performed      Past Medical History  Diagnosis Date  . Hypertension     Past Surgical History  Procedure Laterality Date  . Cesarean section      There were no vitals filed for this visit.      Subjective Assessment - 04/30/16 1258    Subjective L shoulder is doing fine.   Pertinent History L proximal humeral fracture on 12/15/2015 secondary a fall from slipping on ice while walking [place] parking [place] [employer].  Wore an immobilizer from 12/15/15 to 01/15/16 until she went to her orthopedic doctor. Currently using a sling. MD stated that her arm is starting to calcify and her next MD appointment is 02/12/16 (at 9:15 am). Per pt, MD told her to keep her L arm in a sling, and not to drive.   Pt also states that her MD said she can type  with her L hand.    Diagnostic tests Per PA note on 02/12/16: "x-rays demonstrate an essentially nondisplaced oblique fracture of the left surgical neck of the humerus with moderate to excellent callus formation"   Patient Stated Goals Pt expresses desire to be able to lift her L arm straight up. Hold her 82 month old grandson.    Currently in Pain? No/denies   Pain Score 0-No pain            [facility] PT Assessment - 04/30/16 2011    AROM   Right Shoulder Flexion 162 Degrees         Objectives  R shoulder flexion AROM 162 degrees   L shoulder flexion AROM 143 (back against the wall), 140 degrees scaption (back not against the wall) at start of session   There-ex   Directed patient with L shoulder weight shifting in table push-up position 10x3 with 5 seconds Supine AAROM L shoulder flexion to end range with gentle over pressure from PT 10x3 Supine L shoulder flexion AAROM with bar and 5 lbs ankle weight 3x5 seconds  Then with 3 lbs ankle weight 10x5 seconds (reviewed and given as part of her HEP 10x3 with 5 second holds for 2 sets daily; pt demonstrated and verbalized understanding) Supine AAROM L shoulder scaption 10x with gentle over pressure from PT   Standing L shoulder lower trap raise at the wall 10x5 seconds for 2 sets     Improved exercise technique, movement at target joints, use of target muscles after mod verbal, visual, tactile cues.      Manual therapy:  Supine inferior lateral, posterior lateral glide L shoulder joint at end range scaption and flexion grade 4 to 4+ Supine STM L terres major at end range scaption     Standing L shoulder flexion AROM improved to  150 degrees after manual therapy to promote L shoulder capsule mobility and improve L teres major muscle mobility and ROM and strengthening exercises. L shoulder flexion AROM getting closer to R shoulder flexion AROM.                    PT Education - 04/30/16 2004    Education provided Yes   Education Details ther-ex   [employer]) Educated Patient   Methods Explanation;Demonstration;Tactile cues;Verbal cues   Comprehension Verbalized understanding;Returned demonstration             PT Long Term Goals - 04/28/16 0906    PT LONG TERM GOAL #1   Title Patient will have at least 100 degrees L shoulder flexion and scaption PROM to promote ability to raise her L arm when appropriate.    Time 4   Period Weeks   Status Achieved   PT LONG TERM GOAL #2    Title Patient will improve her Quick Dash Disability/Symptom score by at least 10 % as a demonstration of improved function.    Time 6   Period Weeks   Status Achieved   PT LONG TERM GOAL #3   Title When appropriate, patient will have at least 100 degrees L shoulder flexion and scaption AAROM against gravity to promote ability to raise her L arm.   Time 4   Period Weeks   Status Achieved   PT LONG TERM GOAL #4   Title Patient will have at least 140 degrees L shoulder flexion AROM and at least 130 degrees L shoulder scaption AROM to promote ability to reach, perform functional tasks such as fixing her hair.    Time 6   Period Weeks   Status Achieved   PT LONG TERM GOAL #5   Title Pt will have at least 4+/5 L shoulder flexion, scaption/abduction, ER, and IR strength to promote ability to use her L UE for functional tasks.    Baseline 4/5 L shoulder flexion, abduction/scaption, ER, IR   Time 6   Period Weeks   Status Partially Met   Additional Long Term Goals   Additional Long Term Goals Yes   PT LONG TERM GOAL #6   Title Patient will have at least 155 degrees L shoulder flexion and scaption AROM to promote ability to  raise her L arm and reach.    Time 4   Period Weeks   Status New               Plan - 04/30/16 1259    Clinical Impression Statement Standing L shoulder flexion AROM improved to 150 degrees after manual therapy to promote L shoulder capsule mobility and improve L teres major muscle mobility, and ROM and strengthening exercises. L shoulder flexion AROM getting closer to R shoulder flexion AROM.    Rehab Potential Good   Clinical Impairments Affecting Rehab Potential healing time   PT Frequency 2x / week   PT Duration 3 weeks   PT Treatment/Interventions Therapeutic exercise;Therapeutic activities;Manual techniques;Electrical Stimulation;Neuromuscular re-education;Patient/family education;Ultrasound   PT Next Visit Plan soft tissue mobilization, gentle ROM,  scapular movement, posture   PT Home Exercise Plan continue stretching exercises   Consulted and Agree with Plan of Care Patient      Patient will benefit from skilled therapeutic intervention in order to improve the following deficits and impairments:  Decreased range of motion, Postural dysfunction, Decreased strength, Pain  Visit Diagnosis: Muscle weakness (generalized)  Stiffness of left shoulder, not elsewhere classified  Problem List There are no active problems to display for this patient.  Joneen Boers PT, DPT   04/30/2016, 8:12 PM  [place] [facility] [address] [place], [address] Phone: [phone]   Fax:  [phone]  Name: Wylma Tatem Curvin MRN: [id number] Date of Birth: [birth year]

## 2016-04-30 NOTE — Patient Instructions (Signed)
Supine L shoulder flexion AAROM with bar and 3 lbs ankle weight 10x3 with 5 second holds for 2 sets daily (for ROM). Pt demonstrated and verbalized understanding.

## 2016-05-05 ENCOUNTER — Ambulatory Visit: Payer: BLUE CROSS/BLUE SHIELD

## 2016-05-05 DIAGNOSIS — M6281 Muscle weakness (generalized): Secondary | ICD-10-CM

## 2016-05-05 DIAGNOSIS — M25612 Stiffness of left shoulder, not elsewhere classified: Secondary | ICD-10-CM

## 2016-05-05 NOTE — Therapy (Signed)
[place] [facility] [address] [place], [address] Phone: [phone]   Fax:  [phone]  Physical Therapy Treatment  Patient Details  Name: Cassandra Miller MRN: [id number] Date of Birth: 29-Aug-1951 Referring Provider: Damaris Hippo, PA  Encounter Date: 05/05/2016      PT End of Session - 05/05/16 0802    Visit Number 24   Number of Visits 37   Date for PT Re-Evaluation 05/28/16   Authorization Type 8   Authorization Time Period of 10   PT Start Time 0802   PT Stop Time 0845   PT Time Calculation (min) 43 min   Activity Tolerance Patient tolerated treatment well   Behavior During Therapy [facility] for tasks assessed/performed      Past Medical History  Diagnosis Date  . Hypertension     Past Surgical History  Procedure Laterality Date  . Cesarean section      There were no vitals filed for this visit.      Subjective Assessment - 05/05/16 0804    Subjective L shoulder is fine. A little achy. Nothing major.    Pertinent History L proximal humeral fracture on 12/15/2015 secondary a fall from slipping on ice while walking [place] parking lot at [employer].  Wore an immobilizer from 12/15/15 to 01/15/16 until she went to her orthopedic doctor. Currently using a sling. MD stated that her arm is starting to calcify and her next MD appointment is 02/12/16 (at 9:15 am). Per pt, MD told her to keep her L arm in a sling, and not to drive.   Pt also states that her MD said she can type  with her L hand.    Diagnostic tests Per PA note on 02/12/16: "x-rays demonstrate an essentially nondisplaced oblique fracture of the left surgical neck of the humerus with moderate to excellent callus formation"   Patient Stated Goals Pt expresses desire to be able to lift her L arm straight up. Hold her 28 month old grandson.    Currently in Pain? No/denies          Objectives  L shoulder flexion AROM 140 , 147 degrees scaption AROM at start of  session   Manual therapy:  Supine inferior lateral, posterior lateral glide L shoulder joint at end range scaption and flexion grade 3+ to 4 Supine STM L terres major at end range flexion    There-ex  Supine AAROM L shoulder flexion to end range with gentle over pressure from PT 10x3 (to promote ROM) Standing L shoulder lower trap raise at the wall 10x5 seconds for 3 sets (good lower trap muscle use felt by pt) Standing bilateral shoulder ER resisting yellow band 10x3 Standing L shoulder PNF D2 flexion (with neutral cervical spine) resisting yellow band 10x with PT assist, then 5x without PT assist (difficult) Standing L shoulder flexion resisting yellow band 5x (difficult)  Improved exercise technique, movement at target joints, use of target muscles after min to mod verbal, visual, tactile cues.      L shoulder flexion AROM improved to 146 degrees, and L shoulder scaption to 153 degrees after manual therapy to posterior and inferior shoulder joint and terres major. Pt demonstrates stiff posterior and inferior joint capsule with slight improvement in mobility after manual therapy. Good L lower trap and ER muscle use with exercises.  PT Education - 05/05/16 0829    Education provided Yes   Education Details ther-ex   Person(s) Educated Patient   Methods Explanation;Demonstration;Tactile cues;Verbal cues   Comprehension Verbalized understanding;Returned demonstration             PT Long Term Goals - 04/28/16 0906    PT LONG TERM GOAL #1   Title Patient will have at least 100 degrees L shoulder flexion and scaption PROM to promote ability to raise her L arm when appropriate.    Time 4   Period Weeks   Status Achieved   PT LONG TERM GOAL #2   Title Patient will improve her Quick Dash Disability/Symptom score by at least 10 % as a demonstration of improved function.    Time 6   Period Weeks   Status Achieved   PT LONG TERM GOAL #3    Title When appropriate, patient will have at least 100 degrees L shoulder flexion and scaption AAROM against gravity to promote ability to raise her L arm.   Time 4   Period Weeks   Status Achieved   PT LONG TERM GOAL #4   Title Patient will have at least 140 degrees L shoulder flexion AROM and at least 130 degrees L shoulder scaption AROM to promote ability to reach, perform functional tasks such as fixing her hair.    Time 6   Period Weeks   Status Achieved   PT LONG TERM GOAL #5   Title Pt will have at least 4+/5 L shoulder flexion, scaption/abduction, ER, and IR strength to promote ability to use her L UE for functional tasks.    Baseline 4/5 L shoulder flexion, abduction/scaption, ER, IR   Time 6   Period Weeks   Status Partially Met   Additional Long Term Goals   Additional Long Term Goals Yes   PT LONG TERM GOAL #6   Title Patient will have at least 155 degrees L shoulder flexion and scaption AROM to promote ability to  raise her L arm and reach.    Time 4   Period Weeks   Status New               Plan - 05/05/16 0830    Clinical Impression Statement Pt is S/P 20 weeks since injury. L shoulder flexion AROM improved to 146 degrees, and L shoulder scaption to 153 degrees after manual therapy to posterior and inferior shoulder joint and terres major. Pt demonstrates stiff posterior and inferior joint capsule with slight improvement in mobility after manual therapy. Good L lower trap and ER muscle use with exercises.    Rehab Potential Good   Clinical Impairments Affecting Rehab Potential healing time   PT Frequency 2x / week   PT Duration 4 weeks   PT Treatment/Interventions Therapeutic exercise;Therapeutic activities;Manual techniques;Electrical Stimulation;Neuromuscular re-education;Patient/family education;Ultrasound   PT Next Visit Plan soft tissue mobilization, gentle ROM, scapular movement, posture   PT Home Exercise Plan continue stretching exercises   Consulted  and Agree with Plan of Care Patient      Patient will benefit from skilled therapeutic intervention in order to improve the following deficits and impairments:  Decreased range of motion, Postural dysfunction, Decreased strength, Pain  Visit Diagnosis: Muscle weakness (generalized)  Stiffness of left shoulder, not elsewhere classified     Problem List There are no active problems to display for this patient.    Joneen Boers PT, DPT  05/05/2016, 7:20 PM  [place]  [facility] [address], [place], [address] Phone: [phone]   Fax:  [phone]  Name: Cassandra Miller MRN: [id number] Date of Birth: 24-Sep-1951

## 2016-05-07 ENCOUNTER — Ambulatory Visit: Payer: BLUE CROSS/BLUE SHIELD | Attending: Student

## 2016-05-07 DIAGNOSIS — M6281 Muscle weakness (generalized): Secondary | ICD-10-CM | POA: Diagnosis not present

## 2016-05-07 DIAGNOSIS — M25612 Stiffness of left shoulder, not elsewhere classified: Secondary | ICD-10-CM | POA: Diagnosis present

## 2016-05-07 NOTE — Therapy (Signed)
[place] [facility] [address] [place], [address] Phone: [phone]   Fax:  [phone]  Physical Therapy Treatment  Patient Details  Name: Cassandra Miller MRN: [id number] Date of Birth: [birth year] Referring Provider: Damaris Hippo, PA  Encounter Date: 05/07/2016      PT End of Session - 05/07/16 0804    Visit Number 25   Number of Visits 37   Date for PT Re-Evaluation 05/28/16   Authorization Type 9   Authorization Time Period of 10   PT Start Time 0804   PT Stop Time 0848   PT Time Calculation (min) 44 min   Activity Tolerance Patient tolerated treatment well   Behavior During Therapy [facility] for tasks assessed/performed      Past Medical History  Diagnosis Date  . Hypertension     Past Surgical History  Procedure Laterality Date  . Cesarean section      There were no vitals filed for this visit.      Subjective Assessment - 05/07/16 0806    Subjective A little achy. Nothing I'd call pain.    Pertinent History L proximal humeral fracture on 12/15/2015 secondary a fall from slipping on ice while walking [place].  Wore an immobilizer from 12/15/15 to 01/15/16 until she went to her orthopedic doctor. Currently using a sling. MD stated that her arm is starting to calcify and her next MD appointment is 02/12/16 (at 9:15 am). Per pt, MD told her to keep her L arm in a sling, and not to drive.   Pt also states that her MD said she can type  with her L hand.    Diagnostic tests Per PA note on 02/12/16: "x-rays demonstrate an essentially nondisplaced oblique fracture of the left surgical neck of the humerus with moderate to excellent callus formation"   Patient Stated Goals Pt expresses desire to be able to lift her L arm straight up. Hold her 88 month old grandson.    Currently in Pain? No/denies   Pain Score 0-No pain     Pt also adds that she does water aerobics          Objectives  L  shoulder flexion AROM 140 (154 AAROM) , 150 degrees scaption AROM (161 AAROM) at start of session  There-ex  Freestyle and backstroke for shoulder exercises recommended for ROM as well as seated shoulder flexion and abduction quickly with  arms underwater for water resistance when performing exercises at the pool. Pt verbalized understanding.   Directed patient with supine and standing L shoulder flexion AROM multiple times Supine yellow T-band resisted L shoulder flexion to promote strength towards end range 10x2  Reviewed and given as part of her HEP. Pt demonstrated and verbalized understanding  L first rib stretch 30 seconds x 3   Improved exercise technique, movement at target joints, use of target muscles after min to mod verbal, visual, tactile cues.     Manual therapy:   Supine inferior lateral, posterior lateral, inferior/posterior/lateral glide L shoulder joint at end range flexion grade 3+ to 4 Supine STM L terres major at end range flexion Supine manual stretch to L terres major. Pt states discomfort around radial nerve area.   Supine L shoulder flexion AROM 158 degrees; 145 degrees L shoulder flexion AROM in standing afterwards Caudal glide L first rib grade 3 to 3+  Decreased L radial nerve discomfort with supine and standing L shoulder flexion  AROM    Pt demonstrates more AAROM than AROM. Continue strengthening towards end range in addition to joint and soft tissue mobility. Decreased radial nerve tension during L shoulder flexion after caudal glide to L first rib. Pt will benefit from continued skilled physical therapy services to promote ROM, strength, and improve ability to raise her arm.              PT Education - 05/07/16 2122    Education provided Yes   Education Details ther-ex, HEP   Person(s) Educated Patient   Methods Explanation;Demonstration;Tactile cues;Verbal cues;Handout   Comprehension Verbalized understanding;Returned demonstration              PT Long Term Goals - 04/28/16 0906    PT LONG TERM GOAL #1   Title Patient will have at least 100 degrees L shoulder flexion and scaption PROM to promote ability to raise her L arm when appropriate.    Time 4   Period Weeks   Status Achieved   PT LONG TERM GOAL #2   Title Patient will improve her Quick Dash Disability/Symptom score by at least 10 % as a demonstration of improved function.    Time 6   Period Weeks   Status Achieved   PT LONG TERM GOAL #3   Title When appropriate, patient will have at least 100 degrees L shoulder flexion and scaption AAROM against gravity to promote ability to raise her L arm.   Time 4   Period Weeks   Status Achieved   PT LONG TERM GOAL #4   Title Patient will have at least 140 degrees L shoulder flexion AROM and at least 130 degrees L shoulder scaption AROM to promote ability to reach, perform functional tasks such as fixing her hair.    Time 6   Period Weeks   Status Achieved   PT LONG TERM GOAL #5   Title Pt will have at least 4+/5 L shoulder flexion, scaption/abduction, ER, and IR strength to promote ability to use her L UE for functional tasks.    Baseline 4/5 L shoulder flexion, abduction/scaption, ER, IR   Time 6   Period Weeks   Status Partially Met   Additional Long Term Goals   Additional Long Term Goals Yes   PT LONG TERM GOAL #6   Title Patient will have at least 155 degrees L shoulder flexion and scaption AROM to promote ability to  raise her L arm and reach.    Time 4   Period Weeks   Status New               Plan - 05/07/16 2123    Clinical Impression Statement Pt demonstrates more AAROM than AROM. Continue strengthening towards end range in addition to joint and soft tissue mobility. Decreased radial nerve tension during L shoulder flexion after caudal glide to L first rib. Pt will benefit from continued skilled physical therapy services to promote ROM, strength, and improve ability to raise her arm.     Rehab Potential Good   Clinical Impairments Affecting Rehab Potential healing time   PT Frequency 2x / week   PT Duration 4 weeks   PT Treatment/Interventions Therapeutic exercise;Therapeutic activities;Manual techniques;Electrical Stimulation;Neuromuscular re-education;Patient/family education;Ultrasound   PT Next Visit Plan soft tissue mobilization, gentle ROM, scapular movement, posture   PT Home Exercise Plan continue stretching exercises   Consulted and Agree with Plan of Care Patient      Patient will benefit from skilled therapeutic intervention in  order to improve the following deficits and impairments:  Decreased range of motion, Postural dysfunction, Decreased strength, Pain  Visit Diagnosis: Muscle weakness (generalized)  Stiffness of left shoulder, not elsewhere classified     Problem List There are no active problems to display for this patient.  Joneen Boers PT, DPT   05/07/2016, 9:33 PM  [place] [facility] [address], [place], [address] Phone: [phone]   Fax:  [phone]  Name: Cassandra Miller MRN: [id number] Date of Birth: 1951-10-12

## 2016-05-07 NOTE — Patient Instructions (Signed)
Gave supine L shoulder flexion resisting yellow band 10x3 twice daily as part of her HEP. Pt demonstrated and verbalized understanding.

## 2016-05-12 ENCOUNTER — Ambulatory Visit: Payer: BLUE CROSS/BLUE SHIELD

## 2016-05-12 DIAGNOSIS — M6281 Muscle weakness (generalized): Secondary | ICD-10-CM

## 2016-05-12 DIAGNOSIS — M25612 Stiffness of left shoulder, not elsewhere classified: Secondary | ICD-10-CM

## 2016-05-12 NOTE — Therapy (Signed)
[facility] [address] [place], [address] Phone: [phone]   Fax:  [phone]  Physical Therapy Treatment And Progress Report  Patient Details  Name: Cassandra Miller MRN: [id number] Date of Birth: [birth year] Referring Provider: Damaris Hippo, PA  Encounter Date: 05/12/2016      PT End of Session - 05/12/16 0804    Visit Number 26   Number of Visits 37   Date for PT Re-Evaluation 05/28/16   Authorization Type 1   Authorization Time Period of 10   PT Start Time 0805   PT Stop Time 0848   PT Time Calculation (min) 43 min   Activity Tolerance Patient tolerated treatment well   Behavior During Therapy [facility] for tasks assessed/performed      Past Medical History  Diagnosis Date  . Hypertension     Past Surgical History  Procedure Laterality Date  . Cesarean section      There were no vitals filed for this visit.      Subjective Assessment - 05/12/16 0806    Subjective L shoulder is doing fine. No pain or discomfort.    Pertinent History L proximal humeral fracture on 12/15/2015 secondary a fall from slipping on ice while walking in the parking lot at [employer].  Wore an immobilizer from 12/15/15 to 01/15/16 until she went to her orthopedic doctor. Currently using a sling. MD stated that her arm is starting to calcify and her next MD appointment is 02/12/16 (at 9:15 am). Per pt, MD told her to keep her L arm in a sling, and not to drive.   Pt also states that her MD said she can type  with her L hand.    Diagnostic tests Per PA note on 02/12/16: "x-rays demonstrate an essentially nondisplaced oblique fracture of the left surgical neck of the humerus with moderate to excellent callus formation"   Patient Stated Goals Pt expresses desire to be able to lift her L arm straight up. Hold her 14 month old grandson.    Currently in Pain? No/denies   Pain Score 0-No pain   Multiple Pain Sites No            [facility] PT  Assessment - 05/12/16 0808    AROM   Left Shoulder Flexion 150 Degrees  after session   Left Shoulder ABduction 156 Degrees  Scaption AROM after session   Strength   Left Shoulder Flexion 5/5   Left Shoulder ABduction 4+/5   Left Shoulder Internal Rotation 5/5   Left Shoulder External Rotation 4+/5        Objectives  L shoulder flexion AROM 142 (152 AAROM) , 153 degrees scaption AROM (155 AAROM) at start of session  There-ex   Directed patient with standing manually resisted L shoulder flexion, abduction, ER, IR 1-2x each way  Reviewed progress/current status with L shoulder strength with pt.  Supine yellow T-band resisted L shoulder flexion to promote strength towards end range 10x3 Seated L shoulder IR resisting yellow band, arm at about 90 degrees abduction 10x3  Standing L pectoralis major stretch at doorway 30 seconds x 3 L first rib stretch with R cervical side bend 10x10 second holds   Improved exercise technique, movement at target joints, use of target muscles after mod verbal, visual, tactile cues.      Manual therapy:   Supine inferior lateral,  inferior/posterior/lateral glide L shoulder joint at end range flexion grade 3+ to 4 Supine STM  L terres major at end range flexion Supine manual stretch to L terres major.      L shoulder flexion AROM improved to 150 degrees and L shoulder scaption AROM improved to 156 degrees after session. Stiff posterior and inferior lateral glenohumeral joint capsule. Mobility improved after manual therapy. Patient also demonstrates improved L shoulder flexion, abduciton, ER, and IR strength since initially measured on 03/19/2016. Patient still demonstrates some limitations to L shoulder flexion and scaption AROM and would benefit from continued skilled physical therapy services for at least 1 more visit to improve ability to raise her L arm.                PT Education - 05/12/16 0828    Education provided Yes    Education Details ther-ex, progress, current status with L shoulder strength and AROM   Person(s) Educated Patient   Methods Explanation;Demonstration;Verbal cues;Tactile cues   Comprehension Verbalized understanding;Returned demonstration             PT Long Term Goals - 05/12/16 0829    PT LONG TERM GOAL #1   Title Patient will have at least 100 degrees L shoulder flexion and scaption PROM to promote ability to raise her L arm when appropriate.    Time 4   Period Weeks   Status Achieved   PT LONG TERM GOAL #2   Title Patient will improve her Quick Dash Disability/Symptom score by at least 10 % as a demonstration of improved function.    Time 6   Period Weeks   Status Achieved   PT LONG TERM GOAL #3   Title When appropriate, patient will have at least 100 degrees L shoulder flexion and scaption AAROM against gravity to promote ability to raise her L arm.   Time 4   Period Weeks   Status Achieved   PT LONG TERM GOAL #4   Title Patient will have at least 140 degrees L shoulder flexion AROM and at least 130 degrees L shoulder scaption AROM to promote ability to reach, perform functional tasks such as fixing her hair.    Time 6   Period Weeks   Status Achieved   PT LONG TERM GOAL #5   Title Pt will have at least 4+/5 L shoulder flexion, scaption/abduction, ER, and IR strength to promote ability to use her L UE for functional tasks.    Time 6   Period Weeks   Status Achieved   PT LONG TERM GOAL #6   Title Patient will have at least 155 degrees L shoulder flexion and scaption AROM to promote ability to  raise her L arm and reach.    Time 4   Period Weeks   Status On-going               Plan - 05/12/16 [id number]    Clinical Impression Statement L shoulder flexion AROM improved to 150 degrees and L shoulder scaption AROM improved to 156 degrees after session. Stiff posterior and inferior lateral glenohumeral joint capsule. Mobility improved after manual therapy. Patient also  demonstrates improved L shoulder flexion, abduciton, ER, and IR strength since initially measured on 03/19/2016. Patient still demonstrates some limitations to L shoulder flexion and scaption AROM and would benefit from continued skilled physical therapy services for at least 1 more visit to improve ability to raise her L arm.    Rehab Potential Good   Clinical Impairments Affecting Rehab Potential healing time   PT Frequency 2x / week  PT Duration 4 weeks   PT Treatment/Interventions Therapeutic exercise;Therapeutic activities;Manual techniques;Electrical Stimulation;Neuromuscular re-education;Patient/family education;Ultrasound   PT Next Visit Plan soft tissue mobilization, gentle ROM, scapular movement, posture   PT Home Exercise Plan continue stretching exercises   Consulted and Agree with Plan of Care Patient      Patient will benefit from skilled therapeutic intervention in order to improve the following deficits and impairments:  Decreased range of motion, Postural dysfunction, Decreased strength, Pain  Visit Diagnosis: Muscle weakness (generalized)  Stiffness of left shoulder, not elsewhere classified       G-Codes - Jun 01, 2016 0830    Functional Assessment Tool Used patient interview, clinical presentation, clinical judgement, Quick Dash   Functional Limitation Carrying, moving and handling objects   Carrying, Moving and Handling Objects Current Status [id number]) At least 1 percent but less than 20 percent impaired, limited or restricted   Carrying, Moving and Handling Objects Goal Status [id number]) 0 percent impaired, limited or restricted      Problem List There are no active problems to display for this patient.  Thank you for your referral.  Joneen Boers PT, DPT   06/01/2016, 1:36 PM  [facility] [address] [place], [address] Phone: [phone]   Fax:  [phone]  Name: Cassandra Miller MRN:  [id number] Date of Birth: 1951/11/08

## 2016-05-14 ENCOUNTER — Ambulatory Visit: Payer: BLUE CROSS/BLUE SHIELD

## 2016-05-14 DIAGNOSIS — M6281 Muscle weakness (generalized): Secondary | ICD-10-CM | POA: Diagnosis not present

## 2016-05-14 DIAGNOSIS — M25612 Stiffness of left shoulder, not elsewhere classified: Secondary | ICD-10-CM

## 2016-05-14 NOTE — Patient Instructions (Addendum)
    Scapula Adduction With Pectoralis Stretch: Mid-Range - Standing   Perform for your left side Shoulders at 90, keeping weight on feet, lean forward and squeeze shoulder blades together. Hold __30_ seconds. Do __3_ times, _3__ times per day.  http://ss.exer.us/241   Copyright  VHI. All rights reserved.    Gave L first rib stretch as part of her HEP 30 second holds for 3 times, for 3 sets daily. Handout provided. Pt demonstrated and verbalized understanding.

## 2016-05-14 NOTE — Therapy (Addendum)
[facility] [address] [place], [address] Phone: [phone]   Fax:  [phone]  Physical Therapy Treatment And Discharge Summary  Patient Details  Name: Cassandra Miller MRN: [id number] Date of Birth: Apr 13, 1951 Referring Provider: Damaris Hippo, PA  Encounter Date: 05/14/2016      PT End of Session - 05/14/16 0803    Visit Number 27   Number of Visits 37   Date for PT Re-Evaluation 05/28/16   Authorization Type 2   Authorization Time Period of 10   PT Start Time 0803   PT Stop Time 0847   PT Time Calculation (min) 44 min   Activity Tolerance Patient tolerated treatment well   Behavior During Therapy [facility] for tasks assessed/performed      Past Medical History  Diagnosis Date  . Hypertension     Past Surgical History  Procedure Laterality Date  . Cesarean section      There were no vitals filed for this visit.      Subjective Assessment - 05/14/16 0804    Subjective L shoulder is doing fine.    Pertinent History L proximal humeral fracture on 12/15/2015 secondary a fall from slipping on ice while walking [place].  Wore an immobilizer from 12/15/15 to 01/15/16 until she went to her orthopedic doctor. Currently using a sling. MD stated that her arm is starting to calcify and her next MD appointment is 02/12/16 (at 9:15 am). Per pt, MD told her to keep her L arm in a sling, and not to drive.   Pt also states that her MD said she can type  with her L hand.    Diagnostic tests Per PA note on 02/12/16: "x-rays demonstrate an essentially nondisplaced oblique fracture of the left surgical neck of the humerus with moderate to excellent callus formation"   Patient Stated Goals Pt expresses desire to be able to lift her L arm straight up. Hold her 66 month old grandson.    Currently in Pain? No/denies   Pain Score 0-No pain   Multiple Pain Sites No            [facility] PT Assessment - 05/14/16  0955    AROM   Left Shoulder Flexion 150 Degrees  after manual therapy   Left Shoulder ABduction 155 Degrees  scaption AROM         Objectives  L shoulder flexion AROM 145,  155 degrees scaption AROM at start of session  Manual therapy:   Supine inferior lateral, inferior/posterior/lateral glide L shoulder joint at end range flexion grade 3+ to 4 Supine STM L terres major at end range flexion  Supine manual stretch to L terres major.   L shoulder flexion AROM improved to 150 degrees after manual therapy    There-ex   Directed patient with standing L shoulder flexion and scaption AROM multiple times  L first rib stretch 30 seconds x 3  Supine yellow T-band resisted L shoulder flexion to promote strength towards end range 10x3  L pectoralis stretch 30 seconds x 3  Reviewed HEP.   Standing bilateral shoulder ER resisting yellow band 5x3  Standing bilateral scapular retraction resisting red band 10x5 seconds    Improved exercise technique, movement at target joints, use of target muscles after min to mod verbal, visual, tactile cues.                 PT Education - 05/14/16  0841    Education provided Yes   Education Details ther-ex, HEP   Person(s) Educated Patient   Methods Explanation;Demonstration;Tactile cues;Verbal cues;Handout   Comprehension Returned demonstration;Verbalized understanding             PT Long Term Goals - 18-May-2016 1001    PT LONG TERM GOAL #1   Title Patient will have at least 100 degrees L shoulder flexion and scaption PROM to promote ability to raise her L arm when appropriate.    Time 4   Period Weeks   Status Achieved   PT LONG TERM GOAL #2   Title Patient will improve her Quick Dash Disability/Symptom score by at least 10 % as a demonstration of improved function.    Time 6   Period Weeks   Status Achieved   PT LONG TERM GOAL #3   Title When appropriate, patient will have at least 100 degrees L shoulder flexion  and scaption AAROM against gravity to promote ability to raise her L arm.   Time 4   Period Weeks   Status Achieved   PT LONG TERM GOAL #4   Title Patient will have at least 140 degrees L shoulder flexion AROM and at least 130 degrees L shoulder scaption AROM to promote ability to reach, perform functional tasks such as fixing her hair.    Time 6   Period Weeks   Status Achieved   PT LONG TERM GOAL #5   Title Pt will have at least 4+/5 L shoulder flexion, scaption/abduction, ER, and IR strength to promote ability to use her L UE for functional tasks.    Time 6   Period Weeks   Status Achieved   PT LONG TERM GOAL #6   Title Patient will have at least 155 degrees L shoulder flexion and scaption AROM to promote ability to  raise her L arm and reach.    Time 4   Period Weeks   Status Partially Met               Plan - 05-18-16 0958    Clinical Impression Statement Patient has demonstrated improved L shoulder AROM, strength, function, and decreased pain since initial evaluation. Patient has made very good progress towards goals and displays independence with her HEP. Skilled physical therapy services discharged with patient continuing progress with her exercises at home.    Rehab Potential Good   PT Treatment/Interventions Therapeutic exercise;Therapeutic activities;Manual techniques;Electrical Stimulation;Neuromuscular re-education;Patient/family education;Ultrasound   PT Next Visit Plan Continue progress with her HEP.    Consulted and Agree with Plan of Care Patient      Patient will benefit from skilled therapeutic intervention in order to improve the following deficits and impairments:     Visit Diagnosis: Muscle weakness (generalized)  Stiffness of left shoulder, not elsewhere classified       G-Codes - 05/18/2016 1002    Functional Assessment Tool Used patient interview, clinical presentation, clinical judgement, Quick Dash   Functional Limitation Carrying, moving and  handling objects   Carrying, Moving and Handling Objects Goal Status ([id number]) 0 percent impaired, limited or restricted   Carrying, Moving and Handling Objects Discharge Status [phone]) At least 1 percent but less than 20 percent impaired, limited or restricted      Problem List There are no active problems to display for this patient.  Thank you for your referral.  Joneen Boers PT, DPT   05/18/16, 10:06 AM  [facility]  [address] [place], [address] Phone: [phone]   Fax:  [phone]  Name: Cassandra Miller MRN: [id number] Date of Birth: Jan 16, 1951

## 2017-02-17 ENCOUNTER — Ambulatory Visit: Payer: Self-pay | Admitting: Medical

## 2017-02-17 ENCOUNTER — Encounter: Payer: Self-pay | Admitting: Medical

## 2017-02-17 VITALS — BP 145/78 | HR 56 | Temp 98.0°F | Resp 16 | Ht 65.0 in | Wt 195.0 lb

## 2017-02-17 DIAGNOSIS — E7800 Pure hypercholesterolemia, unspecified: Secondary | ICD-10-CM

## 2017-02-17 DIAGNOSIS — H6501 Acute serous otitis media, right ear: Secondary | ICD-10-CM

## 2017-02-17 DIAGNOSIS — H60501 Unspecified acute noninfective otitis externa, right ear: Secondary | ICD-10-CM

## 2017-02-17 DIAGNOSIS — I10 Essential (primary) hypertension: Secondary | ICD-10-CM | POA: Insufficient documentation

## 2017-02-17 IMAGING — MG MM DIGITAL SCREENING BILAT W/ CAD
4 series · 4 of 4 positions shown · non-contrast
Comparison: Previous exam(s).

CLINICAL DATA: Screening.

EXAM:
DIGITAL SCREENING BILATERAL MAMMOGRAM WITH CAD

[L MLO]
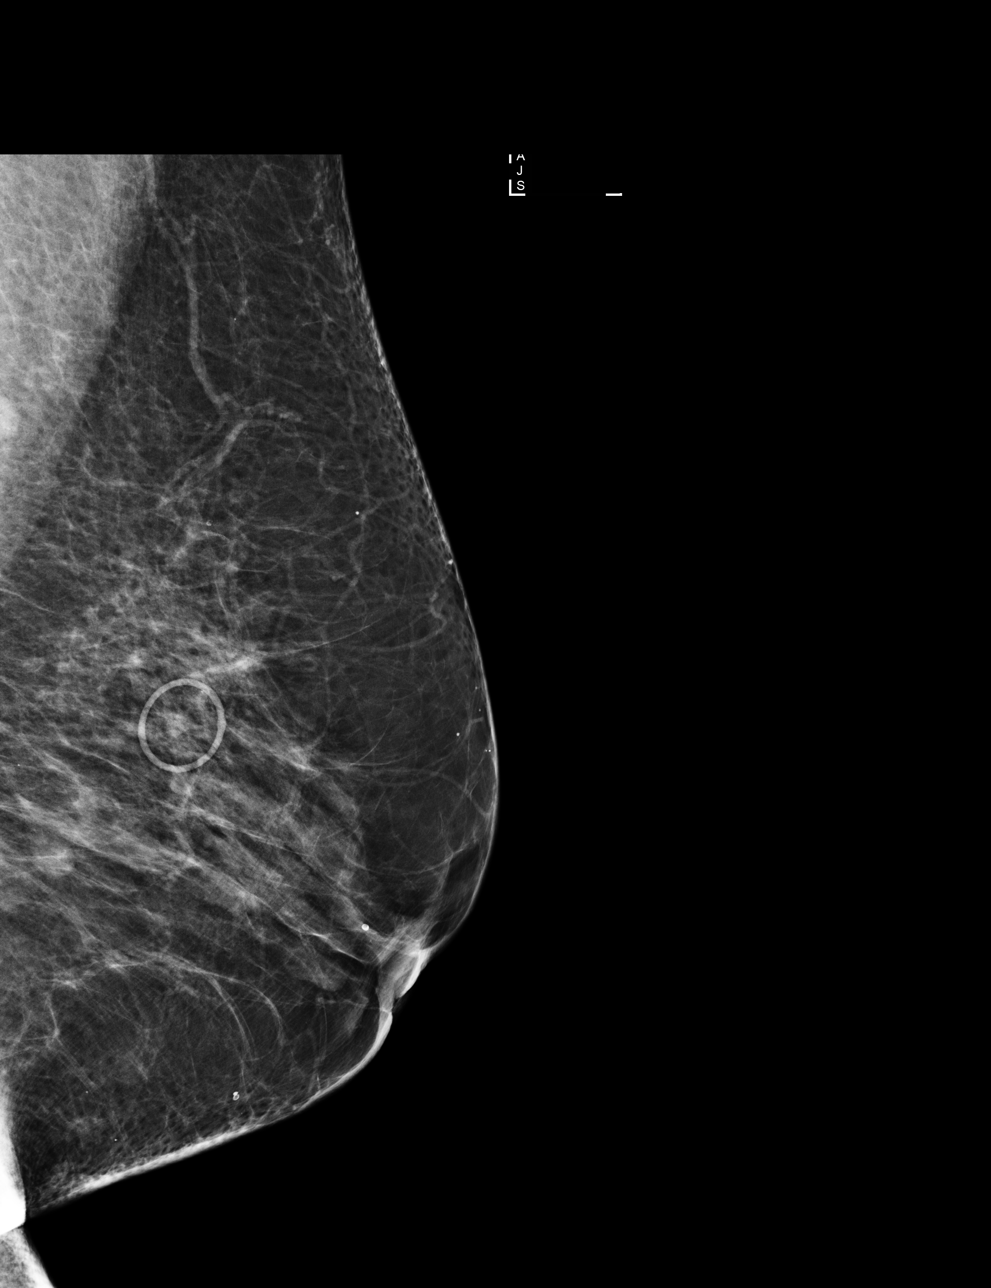

[R CC]
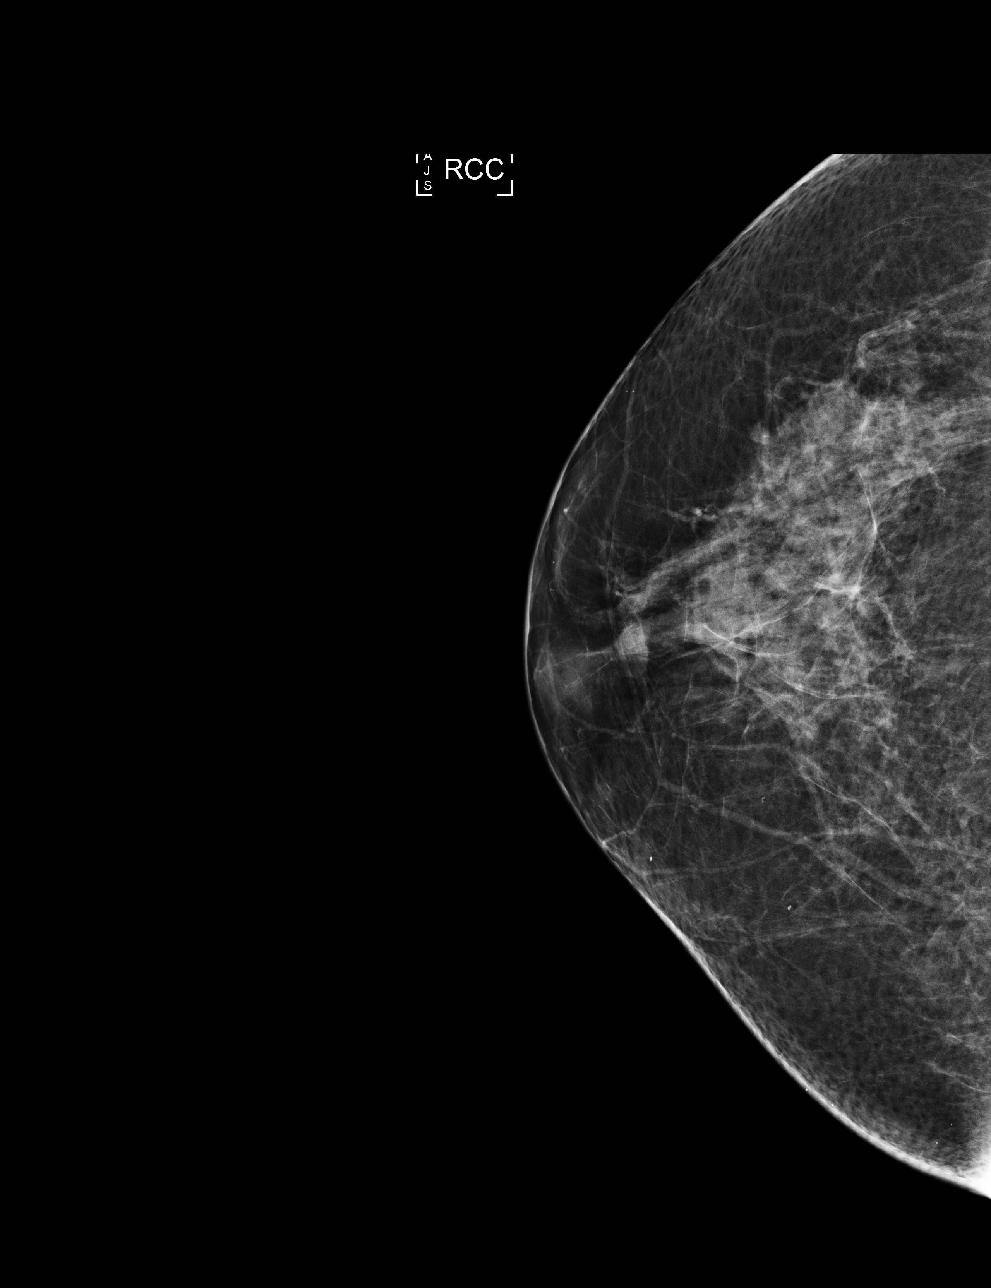

[R MLO]
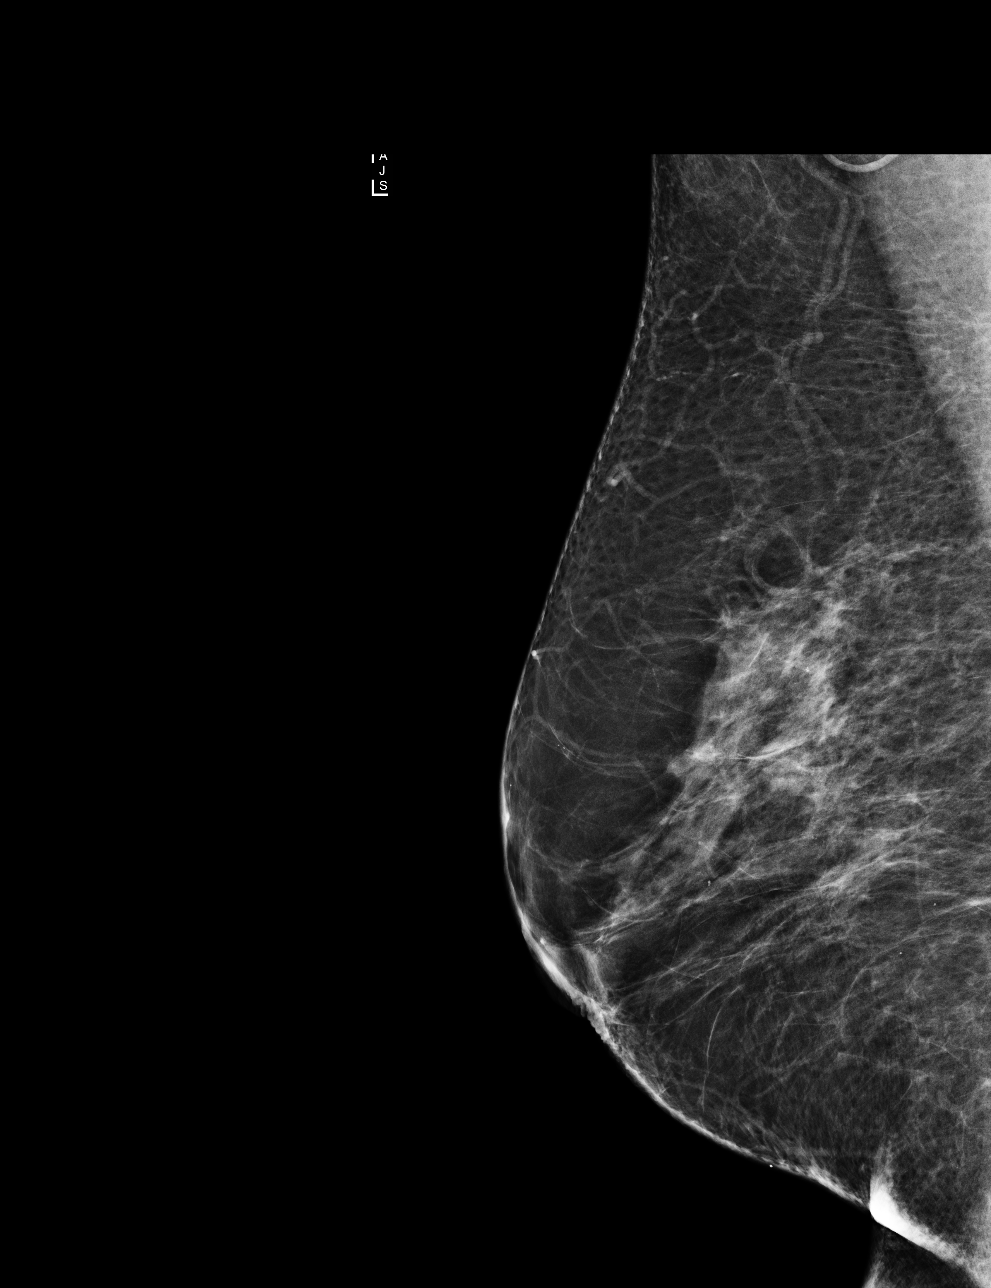

[L CC]
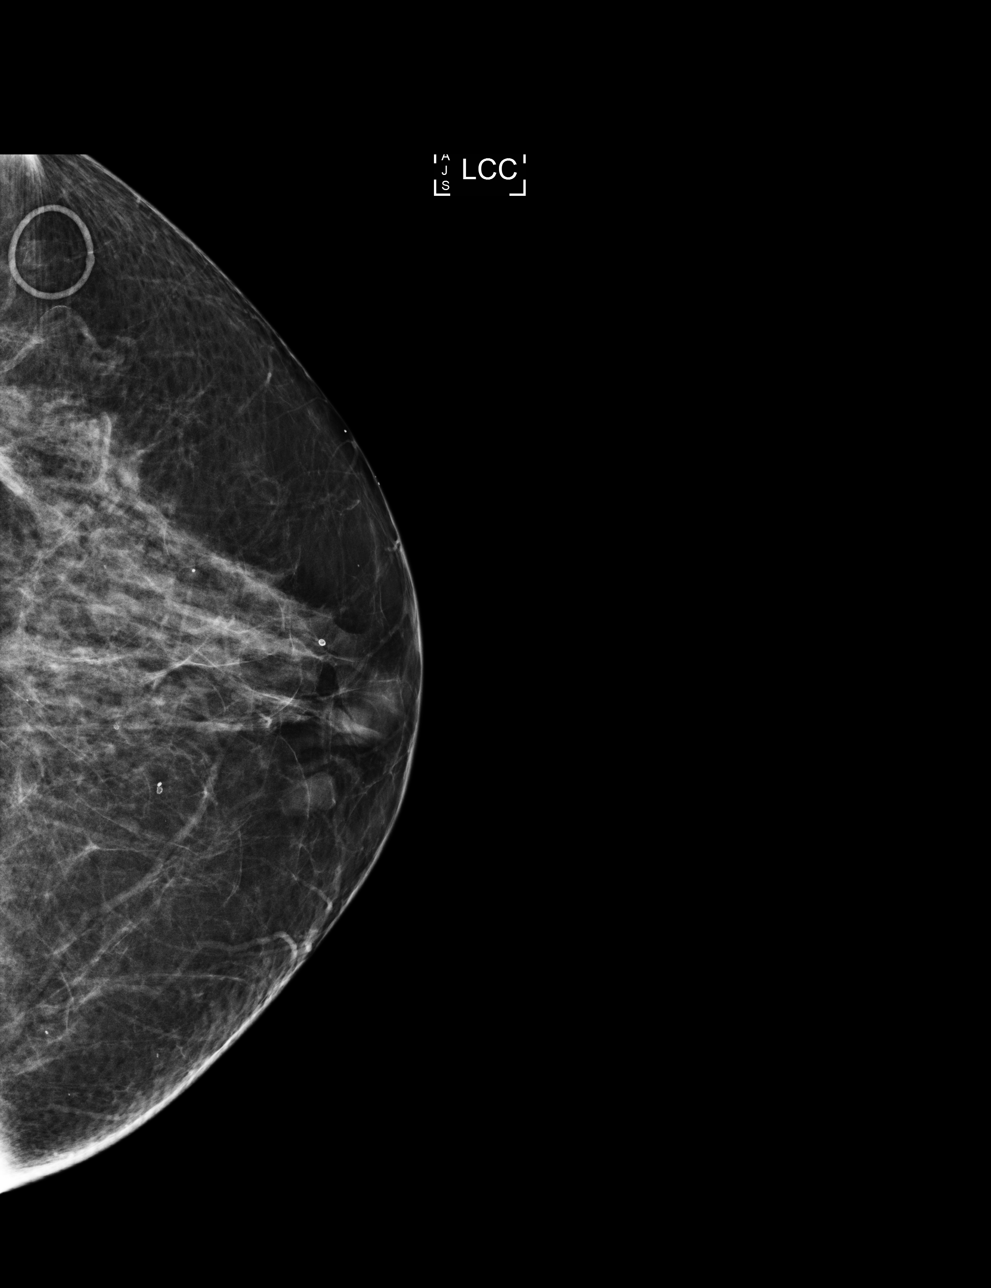

[4 of 4 positions shown; findings below may reference images not displayed]

ACR Breast Density Category b: There are scattered areas of
fibroglandular density.
FINDINGS: In the left breast, a possible mass warrants further evaluation. In
the right breast, no findings suspicious for malignancy.

Images were processed with CAD.
IMPRESSION: Further evaluation is suggested for possible mass in the left
breast.

RECOMMENDATION:
Diagnostic mammogram and possibly ultrasound of the left breast.
(Code:5M-3-PPT)

The patient will be contacted regarding the findings, and additional
imaging will be scheduled.

BI-RADS CATEGORY  0: Incomplete. Need additional imaging evaluation
and/or prior mammograms for comparison.

## 2017-02-17 MED ORDER — NEOMYCIN-POLYMYXIN-HC 1 % OT SOLN: 3.0000 [drp] | Freq: Four times a day (QID) | OTIC | 0 refills | Status: AC

## 2017-02-17 MED ORDER — AMOXICILLIN 875 MG PO TABS: 875.0000 mg | ORAL_TABLET | Freq: Two times a day (BID) | ORAL | 0 refills | Status: DC

## 2017-02-17 NOTE — Progress Notes (Addendum)
Amoxil 875 mg for otitis media right Neomycin, polymixin B Hydrocortisone otic gtts  For otitis externa right  Subjective:    Patient ID: Cassandra Miller, female    DOB: 20-Apr-1951, 66 y.o.   MRN: [id number]  HPI Started last Thursday with tenderness under jaw on right side, then Monday increased pain in the right ear. Yesterday painfull all day. Use prescription ear drops 3 times yesterday applied heating pad to right ear all ngiht. Took no ibuprofen or tylenol for pain.    Review of Systems  Constitutional: Negative for chills and fever.  HENT: Positive for congestion and ear pain. Negative for ear discharge, facial swelling, hearing loss, postnasal drip, sinus pain, sinus pressure and sore throat.   Eyes: Negative.   Respiratory: Negative.   Cardiovascular: Negative.   Gastrointestinal: Negative.   Endocrine: Negative.   Genitourinary: Negative.   Musculoskeletal: Negative.   Neurological: Negative.   Hematological: Positive for adenopathy.  Psychiatric/Behavioral: Negative.        Objective:   Physical Exam  Constitutional: She is oriented to person, place, and time. She appears well-developed and well-nourished.  HENT:  Head: Normocephalic and atraumatic.  Right Ear: There is swelling. No mastoid tenderness. Tympanic membrane is erythematous and bulging. A middle ear effusion is present.  Left Ear: A middle ear effusion is present.  Nose: Mucosal edema present.  Mouth/Throat: Oropharynx is clear and moist.  Eyes: EOM are normal. Pupils are equal, round, and reactive to light.  Cardiovascular: Normal rate, regular rhythm and normal heart sounds.   Pulmonary/Chest: Effort normal and breath sounds normal.  Musculoskeletal: Normal range of motion.  Neurological: She is alert and oriented to person, place, and time.  Skin: Skin is warm and dry.  Psychiatric: She has a normal mood and affect. Her behavior is normal.          Assessment & Plan:  Otitis media right Otitis  externa right Take otc motrin or tylenol take as directed for pain. Return to clinic in 3-5 d if not improving. Amoxil 875 mg for otitis media right Neomycin, polymixin B Hydrocortisone otic gtts  For otitis externa right

## 2017-02-17 NOTE — Patient Instructions (Addendum)
Return to clinic in 3-5 days if not improving or worsening. Take otc motrin or tylenol as needed for pain or fever, take as directed.

## 2017-02-17 NOTE — Addendum Note (Signed)
Addended by: RATCLIFFE, Nira Conn R on: 02/17/2017 10:17 AM   Modules accepted: Orders

## 2017-04-09 ENCOUNTER — Other Ambulatory Visit: Payer: Self-pay | Admitting: Internal Medicine

## 2017-04-09 DIAGNOSIS — Z1231 Encounter for screening mammogram for malignant neoplasm of breast: Secondary | ICD-10-CM

## 2017-04-29 ENCOUNTER — Ambulatory Visit
Admission: RE | Admit: 2017-04-29 | Discharge: 2017-04-29 | Disposition: A | Discharge status: Home / Self Care | Payer: BLUE CROSS/BLUE SHIELD | Source: Ambulatory Visit | Attending: Internal Medicine | Admitting: Internal Medicine

## 2017-04-29 DIAGNOSIS — Z1231 Encounter for screening mammogram for malignant neoplasm of breast: Secondary | ICD-10-CM | POA: Diagnosis not present

## 2017-07-28 ENCOUNTER — Ambulatory Visit (INDEPENDENT_AMBULATORY_CARE_PROVIDER_SITE_OTHER): Payer: BLUE CROSS/BLUE SHIELD | Admitting: Podiatry

## 2017-07-28 ENCOUNTER — Ambulatory Visit (INDEPENDENT_AMBULATORY_CARE_PROVIDER_SITE_OTHER): Payer: BLUE CROSS/BLUE SHIELD

## 2017-07-28 DIAGNOSIS — M6740 Ganglion, unspecified site: Secondary | ICD-10-CM

## 2017-07-28 DIAGNOSIS — M2041 Other hammer toe(s) (acquired), right foot: Secondary | ICD-10-CM

## 2017-07-28 DIAGNOSIS — M2042 Other hammer toe(s) (acquired), left foot: Secondary | ICD-10-CM | POA: Diagnosis not present

## 2017-07-28 DIAGNOSIS — M2010 Hallux valgus (acquired), unspecified foot: Secondary | ICD-10-CM

## 2017-07-28 NOTE — Progress Notes (Signed)
She presents today for chief complaint of a small nodule to the dorsal aspect of the hallux left. She states is been there for about 3-4 months it doesn't hurt.  Objective: Vital signs are stable alert and oriented 3. Pulses are palpable. Neurologic sensorium is intact. Deep tendon reflexes are intact. Muscle strength is normal. Orthopedic evaluation shows rectus foot type with mild bunion deformities mild flexible hammertoe deformities right foot over more rigid hammertoe deformities 2 through 5 of the left foot. Left hallux does demonstrate some plantarflexion at the level interphalangeal joint she has a small fluctuant area just above the joint more than likely a small ganglion or mucoid cyst. Radiographs do not demonstrate any type of osseus 7 valleys in this area today.  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  Plan: Follow up with me on an as-needed basis for surgical consideration regarding mucoid cyst or hammertoe repair.

## 2017-08-10 ENCOUNTER — Ambulatory Visit: Payer: BLUE CROSS/BLUE SHIELD

## 2017-08-10 VITALS — BP 152/80

## 2017-08-10 DIAGNOSIS — I10 Essential (primary) hypertension: Secondary | ICD-10-CM

## 2017-08-10 NOTE — Progress Notes (Signed)
Patient being followed by Dr. Lamonte Sakai for hypertension. Started Amlodipine 5mg  3 days agoa and woke up this morning " feeling fuzzy and ears felt strange". Called Dr. Laurelyn Sickle office and the amlodipine was d/cd and pt started on hydralazine 50mg . Patient will return to clinic for BP check tomorrow and the next day then follow up with Dr. Humphrey Rolls. Denies HA or ringing in the ears.

## 2017-08-11 ENCOUNTER — Ambulatory Visit: Payer: BLUE CROSS/BLUE SHIELD

## 2017-08-12 ENCOUNTER — Ambulatory Visit: Payer: BLUE CROSS/BLUE SHIELD | Admitting: Adult Health

## 2017-08-12 VITALS — BP 148/82 | HR 95

## 2017-08-12 DIAGNOSIS — Z719 Counseling, unspecified: Secondary | ICD-10-CM

## 2017-12-22 NOTE — Progress Notes (Signed)
Provider was only notified of of visit on 08/12/17 by Mallory Shirk RN and was not notified of the two previous nursing notes.

## 2018-04-27 ENCOUNTER — Other Ambulatory Visit: Payer: Self-pay

## 2018-04-27 DIAGNOSIS — I10 Essential (primary) hypertension: Secondary | ICD-10-CM

## 2018-04-27 DIAGNOSIS — E782 Mixed hyperlipidemia: Secondary | ICD-10-CM

## 2018-04-28 ENCOUNTER — Other Ambulatory Visit: Payer: BLUE CROSS/BLUE SHIELD

## 2018-04-28 DIAGNOSIS — I10 Essential (primary) hypertension: Secondary | ICD-10-CM

## 2018-04-28 DIAGNOSIS — E782 Mixed hyperlipidemia: Secondary | ICD-10-CM

## 2018-04-29 LAB — COMPREHENSIVE METABOLIC PANEL
ALT: 34 IU/L — AB (ref 0–32)
AST: 36 IU/L (ref 0–40)
Albumin/Globulin Ratio: 1.9 (ref 1.2–2.2)
Albumin: 4.3 g/dL (ref 3.6–4.8)
Alkaline Phosphatase: 73 IU/L (ref 39–117)
BUN/Creatinine Ratio: 16 (ref 12–28)
BUN: 14 mg/dL (ref 8–27)
Bilirubin Total: 0.3 mg/dL (ref 0.0–1.2)
CALCIUM: 10.2 mg/dL (ref 8.7–10.3)
CO2: 23 mmol/L (ref 20–29)
CREATININE: 0.88 mg/dL (ref 0.57–1.00)
Chloride: 106 mmol/L (ref 96–106)
GFR, EST AFRICAN AMERICAN: 79 mL/min/{1.73_m2} (ref >59)
GFR, EST NON AFRICAN AMERICAN: 68 mL/min/{1.73_m2} (ref >59)
Globulin, Total: 2.3 g/dL (ref 1.5–4.5)
Glucose: 101 mg/dL — ABNORMAL HIGH (ref 65–99)
Potassium: 3.9 mmol/L (ref 3.5–5.2)
Sodium: 141 mmol/L (ref 134–144)
TOTAL PROTEIN: 6.6 g/dL (ref 6.0–8.5)

## 2018-04-29 LAB — LIPID PANEL
CHOLESTEROL TOTAL: 178 mg/dL (ref 100–199)
Chol/HDL Ratio: 2.7 ratio (ref 0.0–4.4)
HDL: 67 mg/dL (ref >39)
LDL CALC: 97 mg/dL (ref 0–99)
Triglycerides: 70 mg/dL (ref 0–149)
VLDL Cholesterol Cal: 14 mg/dL (ref 5–40)

## 2018-04-29 LAB — VITAMIN D 25 HYDROXY (VIT D DEFICIENCY, FRACTURES): VIT D 25 HYDROXY: 47.2 ng/mL (ref 30.0–100.0)

## 2018-05-03 ENCOUNTER — Other Ambulatory Visit: Payer: Self-pay | Admitting: Internal Medicine

## 2018-05-03 DIAGNOSIS — Z1231 Encounter for screening mammogram for malignant neoplasm of breast: Secondary | ICD-10-CM

## 2018-05-09 ENCOUNTER — Other Ambulatory Visit: Payer: Self-pay | Admitting: Internal Medicine

## 2018-05-09 DIAGNOSIS — R600 Localized edema: Secondary | ICD-10-CM

## 2018-05-11 ENCOUNTER — Ambulatory Visit: Payer: Medicare Other

## 2018-05-12 ENCOUNTER — Ambulatory Visit
Admission: RE | Admit: 2018-05-12 | Discharge: 2018-05-12 | Disposition: A | Discharge status: Home / Self Care | Payer: BLUE CROSS/BLUE SHIELD | Source: Ambulatory Visit | Attending: Internal Medicine | Admitting: Internal Medicine

## 2018-05-12 DIAGNOSIS — R221 Localized swelling, mass and lump, neck: Secondary | ICD-10-CM | POA: Insufficient documentation

## 2018-05-12 DIAGNOSIS — R600 Localized edema: Secondary | ICD-10-CM

## 2018-05-19 ENCOUNTER — Ambulatory Visit
Admission: RE | Admit: 2018-05-19 | Discharge: 2018-05-19 | Disposition: A | Discharge status: Home / Self Care | Payer: BLUE CROSS/BLUE SHIELD | Source: Ambulatory Visit | Attending: Internal Medicine | Admitting: Internal Medicine

## 2018-05-19 DIAGNOSIS — Z1231 Encounter for screening mammogram for malignant neoplasm of breast: Secondary | ICD-10-CM | POA: Diagnosis present

## 2018-08-25 ENCOUNTER — Ambulatory Visit: Payer: BLUE CROSS/BLUE SHIELD

## 2019-05-15 DIAGNOSIS — I2510 Atherosclerotic heart disease of native coronary artery without angina pectoris: Secondary | ICD-10-CM | POA: Diagnosis not present

## 2019-05-15 DIAGNOSIS — I351 Nonrheumatic aortic (valve) insufficiency: Secondary | ICD-10-CM | POA: Diagnosis not present

## 2019-05-15 DIAGNOSIS — I340 Nonrheumatic mitral (valve) insufficiency: Secondary | ICD-10-CM | POA: Diagnosis not present

## 2019-05-15 DIAGNOSIS — R0602 Shortness of breath: Secondary | ICD-10-CM | POA: Diagnosis not present

## 2019-05-15 DIAGNOSIS — I10 Essential (primary) hypertension: Secondary | ICD-10-CM | POA: Diagnosis not present

## 2019-05-15 DIAGNOSIS — E783 Hyperchylomicronemia: Secondary | ICD-10-CM | POA: Diagnosis not present

## 2019-06-13 DIAGNOSIS — D225 Melanocytic nevi of trunk: Secondary | ICD-10-CM | POA: Diagnosis not present

## 2019-06-13 DIAGNOSIS — D2262 Melanocytic nevi of left upper limb, including shoulder: Secondary | ICD-10-CM | POA: Diagnosis not present

## 2019-06-13 DIAGNOSIS — D2271 Melanocytic nevi of right lower limb, including hip: Secondary | ICD-10-CM | POA: Diagnosis not present

## 2019-06-13 DIAGNOSIS — D2261 Melanocytic nevi of right upper limb, including shoulder: Secondary | ICD-10-CM | POA: Diagnosis not present

## 2019-06-13 DIAGNOSIS — L821 Other seborrheic keratosis: Secondary | ICD-10-CM | POA: Diagnosis not present

## 2019-06-13 DIAGNOSIS — D485 Neoplasm of uncertain behavior of skin: Secondary | ICD-10-CM | POA: Diagnosis not present

## 2019-06-13 DIAGNOSIS — C4361 Malignant melanoma of right upper limb, including shoulder: Secondary | ICD-10-CM | POA: Diagnosis not present

## 2019-06-13 DIAGNOSIS — D2272 Melanocytic nevi of left lower limb, including hip: Secondary | ICD-10-CM | POA: Diagnosis not present

## 2019-06-23 DIAGNOSIS — D485 Neoplasm of uncertain behavior of skin: Secondary | ICD-10-CM | POA: Diagnosis not present

## 2019-06-26 ENCOUNTER — Other Ambulatory Visit: Payer: Self-pay | Admitting: Internal Medicine

## 2019-06-26 DIAGNOSIS — Z1231 Encounter for screening mammogram for malignant neoplasm of breast: Secondary | ICD-10-CM

## 2019-07-05 DIAGNOSIS — C4361 Malignant melanoma of right upper limb, including shoulder: Secondary | ICD-10-CM | POA: Diagnosis not present

## 2019-07-05 DIAGNOSIS — C439 Malignant melanoma of skin, unspecified: Secondary | ICD-10-CM | POA: Diagnosis not present

## 2019-07-15 DIAGNOSIS — Z1159 Encounter for screening for other viral diseases: Secondary | ICD-10-CM | POA: Diagnosis not present

## 2019-07-18 DIAGNOSIS — C4361 Malignant melanoma of right upper limb, including shoulder: Secondary | ICD-10-CM | POA: Diagnosis not present

## 2019-07-18 DIAGNOSIS — C439 Malignant melanoma of skin, unspecified: Secondary | ICD-10-CM | POA: Diagnosis not present

## 2019-07-18 DIAGNOSIS — I10 Essential (primary) hypertension: Secondary | ICD-10-CM | POA: Diagnosis not present

## 2019-07-18 DIAGNOSIS — E7800 Pure hypercholesterolemia, unspecified: Secondary | ICD-10-CM | POA: Diagnosis not present

## 2019-07-19 ENCOUNTER — Ambulatory Visit: Payer: BLUE CROSS/BLUE SHIELD

## 2019-07-26 DIAGNOSIS — Z09 Encounter for follow-up examination after completed treatment for conditions other than malignant neoplasm: Secondary | ICD-10-CM | POA: Diagnosis not present

## 2019-07-26 DIAGNOSIS — C4361 Malignant melanoma of right upper limb, including shoulder: Secondary | ICD-10-CM | POA: Diagnosis not present

## 2019-10-24 DIAGNOSIS — Z85820 Personal history of malignant melanoma of skin: Secondary | ICD-10-CM | POA: Diagnosis not present

## 2019-10-24 DIAGNOSIS — D2262 Melanocytic nevi of left upper limb, including shoulder: Secondary | ICD-10-CM | POA: Diagnosis not present

## 2019-10-24 DIAGNOSIS — D2261 Melanocytic nevi of right upper limb, including shoulder: Secondary | ICD-10-CM | POA: Diagnosis not present

## 2019-10-24 DIAGNOSIS — D2272 Melanocytic nevi of left lower limb, including hip: Secondary | ICD-10-CM | POA: Diagnosis not present

## 2019-10-24 DIAGNOSIS — L570 Actinic keratosis: Secondary | ICD-10-CM | POA: Diagnosis not present

## 2019-10-24 DIAGNOSIS — L821 Other seborrheic keratosis: Secondary | ICD-10-CM | POA: Diagnosis not present

## 2019-10-26 DIAGNOSIS — I10 Essential (primary) hypertension: Secondary | ICD-10-CM | POA: Diagnosis not present

## 2019-10-26 DIAGNOSIS — I351 Nonrheumatic aortic (valve) insufficiency: Secondary | ICD-10-CM | POA: Diagnosis not present

## 2019-10-26 DIAGNOSIS — I340 Nonrheumatic mitral (valve) insufficiency: Secondary | ICD-10-CM | POA: Diagnosis not present

## 2019-10-26 DIAGNOSIS — E783 Hyperchylomicronemia: Secondary | ICD-10-CM | POA: Diagnosis not present

## 2019-10-26 DIAGNOSIS — E559 Vitamin D deficiency, unspecified: Secondary | ICD-10-CM | POA: Diagnosis not present

## 2019-10-26 DIAGNOSIS — I2510 Atherosclerotic heart disease of native coronary artery without angina pectoris: Secondary | ICD-10-CM | POA: Diagnosis not present

## 2019-10-26 DIAGNOSIS — R0602 Shortness of breath: Secondary | ICD-10-CM | POA: Diagnosis not present

## 2019-10-31 ENCOUNTER — Encounter: Payer: Self-pay | Admitting: Internal Medicine

## 2019-10-31 ENCOUNTER — Telehealth: Payer: Self-pay

## 2019-10-31 ENCOUNTER — Other Ambulatory Visit: Payer: Self-pay

## 2019-10-31 DIAGNOSIS — Z1211 Encounter for screening for malignant neoplasm of colon: Secondary | ICD-10-CM

## 2019-10-31 NOTE — Telephone Encounter (Signed)
Gastroenterology Pre-Procedure Review  Request Date: Friday 11/17/19 Requesting Physician: Dr. Allen Norris  PATIENT REVIEW QUESTIONS: The patient responded to the following health history questions as indicated:    1. Are you having any GI issues? occsasional hemmorhoids 2. Do you have a personal history of Polyps? no 3. Do you have a family history of Colon Cancer or Polyps? yes (dad colon polyps) 4. Diabetes Mellitus? no 5. Joint replacements in the past 12 months?no 6. Major health problems in the past 3 months?melanoma removed from right arm in August 7. Any artificial heart valves, MVP, or defibrillator?no    MEDICATIONS & ALLERGIES:    Patient reports the following regarding taking any anticoagulation/antiplatelet therapy:   Plavix, Coumadin, Eliquis, Xarelto, Lovenox, Pradaxa, Brilinta, or Effient? no Aspirin? Yes 81 mg daily  Patient confirms/reports the following medications:  Current Outpatient Medications  Medication Sig Dispense Refill  . amLODipine (NORVASC) 5 MG tablet Take 5 mg by mouth daily.  3  . aspirin 81 MG tablet Take 81 mg by mouth daily.    . Calcium Carb-Cholecalciferol (CALCIUM + D3) 600-200 MG-UNIT TABS Take by mouth.    . Cholecalciferol (VITAMIN D-3) 1000 UNITS CAPS Take by mouth.    . cyanocobalamin 1000 MCG tablet Take 100 mcg by mouth daily.    . hydrALAZINE (APRESOLINE) 50 MG tablet Take 50 mg by mouth every morning.    Marland Kitchen losartan-hydrochlorothiazide (HYZAAR) 100-25 MG tablet TK 1 T PO D  3  . Multiple Vitamin (MULTIVITAMIN) capsule Take 1 capsule by mouth daily.    . simvastatin (ZOCOR) 20 MG tablet      No current facility-administered medications for this visit.     Patient confirms/reports the following allergies:  Allergies  Allergen Reactions  . Iodinated Diagnostic Agents     Rash    No orders of the defined types were placed in this encounter.   AUTHORIZATION INFORMATION Primary Insurance: 1D#: Group #:  Secondary  Insurance: 1D#: Group #:  SCHEDULE INFORMATION: Date: Friday 11/17/19 Time: Location:[facility]

## 2019-11-14 ENCOUNTER — Other Ambulatory Visit
Admission: RE | Admit: 2019-11-14 | Discharge: 2019-11-14 | Disposition: A | Discharge status: Home / Self Care | Payer: PPO | Source: Ambulatory Visit | Attending: Gastroenterology | Admitting: Gastroenterology

## 2019-11-14 DIAGNOSIS — Z20828 Contact with and (suspected) exposure to other viral communicable diseases: Secondary | ICD-10-CM | POA: Insufficient documentation

## 2019-11-14 DIAGNOSIS — Z01812 Encounter for preprocedural laboratory examination: Secondary | ICD-10-CM | POA: Diagnosis not present

## 2019-11-15 LAB — SARS CORONAVIRUS 2 (TAT 6-24 HRS): SARS Coronavirus 2: NEGATIVE

## 2019-11-17 ENCOUNTER — Other Ambulatory Visit: Payer: Self-pay

## 2019-11-17 ENCOUNTER — Ambulatory Visit: Payer: PPO | Admitting: Certified Registered Nurse Anesthetist

## 2019-11-17 ENCOUNTER — Encounter: Payer: Self-pay | Admitting: Gastroenterology

## 2019-11-17 ENCOUNTER — Encounter
Admission: RE | Disposition: A | Discharge status: Home / Self Care | Payer: Self-pay | Source: Home / Self Care | Attending: Gastroenterology

## 2019-11-17 ENCOUNTER — Ambulatory Visit
Admission: RE | Admit: 2019-11-17 | Discharge: 2019-11-17 | Disposition: A | Discharge status: Home / Self Care | Payer: PPO | Attending: Gastroenterology | Admitting: Gastroenterology

## 2019-11-17 DIAGNOSIS — Z85828 Personal history of other malignant neoplasm of skin: Secondary | ICD-10-CM | POA: Insufficient documentation

## 2019-11-17 DIAGNOSIS — Z1211 Encounter for screening for malignant neoplasm of colon: Secondary | ICD-10-CM

## 2019-11-17 DIAGNOSIS — K641 Second degree hemorrhoids: Secondary | ICD-10-CM | POA: Insufficient documentation

## 2019-11-17 DIAGNOSIS — Z7982 Long term (current) use of aspirin: Secondary | ICD-10-CM | POA: Diagnosis not present

## 2019-11-17 DIAGNOSIS — Z79899 Other long term (current) drug therapy: Secondary | ICD-10-CM | POA: Insufficient documentation

## 2019-11-17 DIAGNOSIS — I10 Essential (primary) hypertension: Secondary | ICD-10-CM | POA: Diagnosis not present

## 2019-11-17 DIAGNOSIS — Z91041 Radiographic dye allergy status: Secondary | ICD-10-CM | POA: Insufficient documentation

## 2019-11-17 DIAGNOSIS — E785 Hyperlipidemia, unspecified: Secondary | ICD-10-CM | POA: Insufficient documentation

## 2019-11-17 HISTORY — DX: Unspecified malignant neoplasm of skin, unspecified: C44.90

## 2019-11-17 HISTORY — PX: COLONOSCOPY WITH PROPOFOL: SHX5780

## 2019-11-17 HISTORY — DX: Hyperlipidemia, unspecified: E78.5

## 2019-11-17 SURGERY — COLONOSCOPY WITH PROPOFOL
Anesthesia: General

## 2019-11-17 MED ORDER — GLYCOPYRROLATE 0.2 MG/ML IJ SOLN
INTRAMUSCULAR | Status: DC | PRN
  Administered 2019-11-17: .2 mg via INTRAVENOUS

## 2019-11-17 MED ORDER — LIDOCAINE HCL (PF) 2 % IJ SOLN
INTRAMUSCULAR | Status: AC
  Filled 2019-11-17: qty 10

## 2019-11-17 MED ORDER — PROPOFOL 10 MG/ML IV BOLUS
INTRAVENOUS | Status: AC
  Filled 2019-11-17: qty 20

## 2019-11-17 MED ORDER — PHENYLEPHRINE HCL (PRESSORS) 10 MG/ML IV SOLN
INTRAVENOUS | Status: DC | PRN
  Administered 2019-11-17: 50 ug via INTRAVENOUS

## 2019-11-17 MED ORDER — MIDAZOLAM HCL 2 MG/2ML IJ SOLN
INTRAMUSCULAR | Status: AC
  Filled 2019-11-17: qty 2

## 2019-11-17 MED ORDER — SODIUM CHLORIDE 0.9 % IV SOLN
INTRAVENOUS | Status: DC
  Administered 2019-11-17: 09:00:00 via INTRAVENOUS

## 2019-11-17 MED ORDER — PROPOFOL 10 MG/ML IV BOLUS
INTRAVENOUS | Status: DC | PRN
  Administered 2019-11-17: 30 mg via INTRAVENOUS
  Administered 2019-11-17: 150 ug/kg/min via INTRAVENOUS

## 2019-11-17 NOTE — Transfer of Care (Signed)
Immediate Anesthesia Transfer of Care Note  Patient: Cassandra Miller  Procedure(s) Performed: COLONOSCOPY WITH PROPOFOL (N/A )  Patient Location: PACU  Anesthesia Type:General  Level of Consciousness: sedated  Airway & Oxygen Therapy: Patient Spontanous Breathing and Patient connected to nasal cannula oxygen  Post-op Assessment: Report given to RN and Post -op Vital signs reviewed and stable  Post vital signs: Reviewed and stable  Last Vitals:  Vitals Value Taken Time  BP 87/51 11/17/19 0933  Temp 35.8 C 11/17/19 0931  Pulse 53 11/17/19 0934  Resp 17 11/17/19 0934  SpO2 96 % 11/17/19 0934  Vitals shown include unvalidated device data.  Last Pain:  Vitals:   11/17/19 0931  TempSrc: Temporal  PainSc: Asleep         Complications: No apparent anesthesia complications

## 2019-11-17 NOTE — Anesthesia Postprocedure Evaluation (Signed)
Anesthesia Post Note  Patient: Marcy Panning Strey  Procedure(s) Performed: COLONOSCOPY WITH PROPOFOL (N/A )  Patient location during evaluation: PACU Anesthesia Type: General Level of consciousness: awake and alert Pain management: pain level controlled Vital Signs Assessment: post-procedure vital signs reviewed and stable Respiratory status: spontaneous breathing, nonlabored ventilation, respiratory function stable and patient connected to nasal cannula oxygen Cardiovascular status: blood pressure returned to baseline and stable Postop Assessment: no apparent nausea or vomiting Anesthetic complications: no     Last Vitals:  Vitals:   11/17/19 0931 11/17/19 0951  BP: (!) 87/51 121/72  Pulse: (!) 52   Resp: 18   Temp: (!) 35.8 C   SpO2: 97%     Last Pain:  Vitals:   11/17/19 0951  TempSrc:   PainSc: 0-No pain                 Precious Haws Luisangel Wainright

## 2019-11-17 NOTE — Op Note (Signed)
[facility] Gastroenterology Patient Name: Cassandra Miller Procedure Date: 11/17/2019 9:07 AM MRN: [id number] Account #: [id number] Date of Birth: 05-Aug-1951 Admit Type: Outpatient Age: 68 Room: [facility] ENDO ROOM 4 Gender: Female Note Status: Finalized Procedure:             Colonoscopy Indications:           Screening for colorectal malignant neoplasm Providers:             Lucilla Lame MD, MD Referring MD:          Perrin Maltese, MD (Referring MD) Medicines:             Propofol per Anesthesia Complications:         No immediate complications. Procedure:             Pre-Anesthesia Assessment:                        - Prior to the procedure, a History and Physical was                         performed, and patient medications and allergies were                         reviewed. The patient's tolerance of previous                         anesthesia was also reviewed. The risks and benefits                         of the procedure and the sedation options and risks                         were discussed with the patient. All questions were                         answered, and informed consent was obtained. Prior                         Anticoagulants: The patient has taken no previous                         anticoagulant or antiplatelet agents. ASA Grade                         Assessment: II - A patient with mild systemic disease.                         After reviewing the risks and benefits, the patient                         was deemed in satisfactory condition to undergo the                         procedure.                        After obtaining informed consent, the colonoscope was  passed under direct vision. Throughout the procedure,                         the patient's blood pressure, pulse, and oxygen                         saturations were monitored continuously. The                         Colonoscope was introduced through the  anus and                         advanced to the the cecum, identified by appendiceal                         orifice and ileocecal valve. The colonoscopy was                         performed without difficulty. The patient tolerated                         the procedure well. The quality of the bowel                         preparation was excellent. Findings:      The perianal and digital rectal examinations were normal.      Non-bleeding internal hemorrhoids were found during retroflexion. The       hemorrhoids were Grade II (internal hemorrhoids that prolapse but reduce       spontaneously). Impression:            - Non-bleeding internal hemorrhoids.                        - No specimens collected. Recommendation:        - Discharge patient to home.                        - Resume previous diet.                        - Continue present medications. Procedure Code(s):     --- Professional ---                        [phone], Colonoscopy, flexible; diagnostic, including                         collection of specimen(s) by brushing or washing, when                         performed (separate procedure) Diagnosis Code(s):     --- Professional ---                        Z12.11, Encounter for screening for malignant neoplasm                         of colon CPT copyright 2019 American Medical Association. All rights reserved. The codes documented in this report are preliminary and upon coder review may  be revised to meet current  compliance requirements. Lucilla Lame MD, MD 11/17/2019 9:35:06 AM This report has been signed electronically. Number of Addenda: 0 Note Initiated On: 11/17/2019 9:07 AM Scope Withdrawal Time: 0 hours 7 minutes 59 seconds  Total Procedure Duration: 0 hours 11 minutes 44 seconds  Estimated Blood Loss:  Estimated blood loss: none.      [facility]

## 2019-11-17 NOTE — Anesthesia Post-op Follow-up Note (Signed)
Anesthesia QCDR form completed.        

## 2019-11-17 NOTE — H&P (Signed)
Lucilla Lame, MD [address] [place], [place] [address] Phone: [phone] Fax : [phone]  Primary Care Physician:  Perrin Maltese, MD Primary Gastroenterologist:  Dr. Allen Norris  Pre-Procedure History & Physical: HPI:  Cassandra Miller is a 68 y.o. female is here for a screening colonoscopy.   Past Medical History:  Diagnosis Date  . Hyperlipemia   . Hypertension   . Skin cancer     Past Surgical History:  Procedure Laterality Date  . CESAREAN SECTION    . LYMPH NODE DISSECTION    . SKIN CANCER EXCISION      Prior to Admission medications   Medication Sig Start Date End Date Taking? Authorizing Provider  amLODipine (NORVASC) 5 MG tablet Take 5 mg by mouth daily. 08/06/17  Yes [provider]  aspirin 81 MG tablet Take 81 mg by mouth daily.   Yes [provider]  Calcium Carb-Cholecalciferol (CALCIUM + D3) 600-200 MG-UNIT TABS Take by mouth.   Yes [provider]  Cholecalciferol (VITAMIN D-3) 1000 UNITS CAPS Take by mouth.   Yes [provider]  cyanocobalamin 1000 MCG tablet Take 100 mcg by mouth daily.   Yes [provider]  hydrALAZINE (APRESOLINE) 50 MG tablet Take 50 mg by mouth every morning.   Yes [provider]  losartan-hydrochlorothiazide (HYZAAR) 100-25 MG tablet TK 1 T PO D 06/22/17  Yes [provider]  Multiple Vitamin (MULTIVITAMIN) capsule Take 1 capsule by mouth daily.   Yes [provider]  simvastatin (ZOCOR) 20 MG tablet  01/20/14  Yes [provider]    Allergies as of 10/31/2019 - Review Complete 10/12/2017  Allergen Reaction Noted  . Iodinated diagnostic agents  10/31/2019    Family History  Problem Relation Age of Onset  . Breast cancer Paternal Aunt        28's  . Breast cancer Paternal Grandmother        9's    Social History   Socioeconomic History  . Marital status: Married    Spouse name: Not on file  . Number of children: Not on file  .  Years of education: Not on file  . Highest education level: Not on file  Occupational History  . Not on file  Tobacco Use  . Smoking status: Never Smoker  . Smokeless tobacco: Never Used  Substance and Sexual Activity  . Alcohol use: Yes    Comment: social  . Drug use: No  . Sexual activity: Not on file  Other Topics Concern  . Not on file  Social History Narrative  . Not on file   Social Determinants of Health   Financial Resource Strain:   . Difficulty of Paying Living Expenses: Not on file  Food Insecurity:   . Worried About Charity fundraiser in the Last Year: Not on file  . Ran Out of Food in the Last Year: Not on file  Transportation Needs:   . Lack of Transportation (Medical): Not on file  . Lack of Transportation (Non-Medical): Not on file  Physical Activity:   . Days of Exercise per Week: Not on file  . Minutes of Exercise per Session: Not on file  Stress:   . Feeling of Stress : Not on file  Social Connections:   . Frequency of Communication with Friends and Family: Not on file  . Frequency of Social Gatherings with Friends and Family: Not on file  . Attends Religious Services: Not on file  .  Active Member of Clubs or Organizations: Not on file  . Attends Archivist Meetings: Not on file  . Marital Status: Not on file  Intimate Partner Violence:   . Fear of Current or Ex-Partner: Not on file  . Emotionally Abused: Not on file  . Physically Abused: Not on file  . Sexually Abused: Not on file    Review of Systems: See HPI, otherwise negative ROS  Physical Exam: BP (!) 142/72   Pulse 70   Temp 98 F (36.7 C) (Temporal)   Resp 16   Ht 5\' 5"  (1.651 m)   Wt 86.2 kg   SpO2 94%   BMI 31.62 kg/m  General:   Alert,  pleasant and cooperative in NAD Head:  Normocephalic and atraumatic. Neck:  Supple; no masses or thyromegaly. Lungs:  Clear throughout to auscultation.    Heart:  Regular rate and rhythm. Abdomen:  Soft, nontender and  nondistended. Normal bowel sounds, without guarding, and without rebound.   Neurologic:  Alert and  oriented x4;  grossly normal neurologically.  Impression/Plan: Cassandra Miller is now here to undergo a screening colonoscopy.  Risks, benefits, and alternatives regarding colonoscopy have been reviewed with the patient.  Questions have been answered.  All parties agreeable.

## 2019-11-17 NOTE — Anesthesia Preprocedure Evaluation (Signed)
Anesthesia Evaluation  Patient identified by MRN, date of birth, ID band Patient awake    Reviewed: Allergy & Precautions, H&P , NPO status , Patient's Chart, lab work & pertinent test results  History of Anesthesia Complications Negative for: history of anesthetic complications  Airway Mallampati: III  TM Distance: <3 FB Neck ROM: full    Dental  (+) Chipped   Pulmonary neg pulmonary ROS, neg shortness of breath,           Cardiovascular Exercise Tolerance: Good hypertension, (-) angina(-) Past MI and (-) DOE      Neuro/Psych negative neurological ROS  negative psych ROS   GI/Hepatic negative GI ROS, Neg liver ROS, neg GERD  ,  Endo/Other  negative endocrine ROS  Renal/GU negative Renal ROS  negative genitourinary   Musculoskeletal   Abdominal   Peds  Hematology negative hematology ROS (+)   Anesthesia Other Findings Past Medical History: No date: Hyperlipemia No date: Hypertension No date: Skin cancer  Past Surgical History: No date: CESAREAN SECTION No date: LYMPH NODE DISSECTION No date: SKIN CANCER EXCISION  BMI    Body Mass Index: 31.62 kg/m      Reproductive/Obstetrics negative OB ROS                             Anesthesia Physical Anesthesia Plan  ASA: III  Anesthesia Plan: General   Post-op Pain Management:    Induction: Intravenous  PONV Risk Score and Plan: Propofol infusion and TIVA  Airway Management Planned: Natural Airway and Nasal Cannula  Additional Equipment:   Intra-op Plan:   Post-operative Plan:   Informed Consent: I have reviewed the patients History and Physical, chart, labs and discussed the procedure including the risks, benefits and alternatives for the proposed anesthesia with the patient or authorized representative who has indicated his/her understanding and acceptance.     Dental Advisory Given  Plan Discussed with:  Anesthesiologist, CRNA and Surgeon  Anesthesia Plan Comments: (Patient consented for risks of anesthesia including but not limited to:  - adverse reactions to medications - risk of intubation if required - damage to teeth, lips or other oral mucosa - sore throat or hoarseness - Damage to heart, brain, lungs or loss of life  Patient voiced understanding.)        Anesthesia Quick Evaluation

## 2019-12-19 ENCOUNTER — Ambulatory Visit
Admission: RE | Admit: 2019-12-19 | Discharge: 2019-12-19 | Disposition: A | Discharge status: Home / Self Care | Payer: PPO | Source: Ambulatory Visit | Attending: Internal Medicine | Admitting: Internal Medicine

## 2019-12-19 DIAGNOSIS — Z1231 Encounter for screening mammogram for malignant neoplasm of breast: Secondary | ICD-10-CM | POA: Insufficient documentation

## 2020-01-24 DIAGNOSIS — C4361 Malignant melanoma of right upper limb, including shoulder: Secondary | ICD-10-CM | POA: Diagnosis not present

## 2020-03-29 DIAGNOSIS — Z20822 Contact with and (suspected) exposure to covid-19: Secondary | ICD-10-CM | POA: Diagnosis not present

## 2020-03-29 DIAGNOSIS — Z03818 Encounter for observation for suspected exposure to other biological agents ruled out: Secondary | ICD-10-CM | POA: Diagnosis not present

## 2020-03-29 DIAGNOSIS — Z20828 Contact with and (suspected) exposure to other viral communicable diseases: Secondary | ICD-10-CM | POA: Diagnosis not present

## 2020-04-25 DIAGNOSIS — I340 Nonrheumatic mitral (valve) insufficiency: Secondary | ICD-10-CM | POA: Diagnosis not present

## 2020-04-25 DIAGNOSIS — R0602 Shortness of breath: Secondary | ICD-10-CM | POA: Diagnosis not present

## 2020-04-25 DIAGNOSIS — E782 Mixed hyperlipidemia: Secondary | ICD-10-CM | POA: Diagnosis not present

## 2020-04-25 DIAGNOSIS — I10 Essential (primary) hypertension: Secondary | ICD-10-CM | POA: Diagnosis not present

## 2020-04-25 DIAGNOSIS — I351 Nonrheumatic aortic (valve) insufficiency: Secondary | ICD-10-CM | POA: Diagnosis not present

## 2020-04-25 DIAGNOSIS — I2510 Atherosclerotic heart disease of native coronary artery without angina pectoris: Secondary | ICD-10-CM | POA: Diagnosis not present

## 2020-04-26 DIAGNOSIS — L570 Actinic keratosis: Secondary | ICD-10-CM | POA: Diagnosis not present

## 2020-04-26 DIAGNOSIS — D225 Melanocytic nevi of trunk: Secondary | ICD-10-CM | POA: Diagnosis not present

## 2020-04-26 DIAGNOSIS — D2271 Melanocytic nevi of right lower limb, including hip: Secondary | ICD-10-CM | POA: Diagnosis not present

## 2020-04-26 DIAGNOSIS — D2262 Melanocytic nevi of left upper limb, including shoulder: Secondary | ICD-10-CM | POA: Diagnosis not present

## 2020-04-26 DIAGNOSIS — Z85820 Personal history of malignant melanoma of skin: Secondary | ICD-10-CM | POA: Diagnosis not present

## 2020-04-26 DIAGNOSIS — X32XXXA Exposure to sunlight, initial encounter: Secondary | ICD-10-CM | POA: Diagnosis not present

## 2020-04-26 DIAGNOSIS — D2261 Melanocytic nevi of right upper limb, including shoulder: Secondary | ICD-10-CM | POA: Diagnosis not present

## 2020-07-24 DIAGNOSIS — C4361 Malignant melanoma of right upper limb, including shoulder: Secondary | ICD-10-CM | POA: Diagnosis not present

## 2020-08-26 DIAGNOSIS — I340 Nonrheumatic mitral (valve) insufficiency: Secondary | ICD-10-CM | POA: Diagnosis not present

## 2020-08-26 DIAGNOSIS — E559 Vitamin D deficiency, unspecified: Secondary | ICD-10-CM | POA: Diagnosis not present

## 2020-08-26 DIAGNOSIS — E782 Mixed hyperlipidemia: Secondary | ICD-10-CM | POA: Diagnosis not present

## 2020-08-26 DIAGNOSIS — I10 Essential (primary) hypertension: Secondary | ICD-10-CM | POA: Diagnosis not present

## 2020-09-30 ENCOUNTER — Ambulatory Visit: Payer: PPO | Attending: Internal Medicine

## 2020-09-30 DIAGNOSIS — Z23 Encounter for immunization: Secondary | ICD-10-CM

## 2020-09-30 NOTE — Progress Notes (Signed)
   Covid-19 Vaccination Clinic  Name:  Cassandra Miller    MRN: [id number] DOB: 1951-04-29  09/30/2020  [name] was observed post Covid-19 immunization for 15 minutes without incident. She was provided with Vaccine Information Sheet and instruction to access the V-Safe system.   [name] was instructed to call 911 with any severe reactions post vaccine: Marland Kitchen Difficulty breathing  . Swelling of face and throat  . A fast heartbeat  . A bad rash all over body  . Dizziness and weakness

## 2020-10-28 DIAGNOSIS — Z85820 Personal history of malignant melanoma of skin: Secondary | ICD-10-CM | POA: Diagnosis not present

## 2020-10-28 DIAGNOSIS — D2271 Melanocytic nevi of right lower limb, including hip: Secondary | ICD-10-CM | POA: Diagnosis not present

## 2020-10-28 DIAGNOSIS — D225 Melanocytic nevi of trunk: Secondary | ICD-10-CM | POA: Diagnosis not present

## 2020-10-28 DIAGNOSIS — L821 Other seborrheic keratosis: Secondary | ICD-10-CM | POA: Diagnosis not present

## 2020-10-28 DIAGNOSIS — D2262 Melanocytic nevi of left upper limb, including shoulder: Secondary | ICD-10-CM | POA: Diagnosis not present

## 2020-10-28 DIAGNOSIS — D2261 Melanocytic nevi of right upper limb, including shoulder: Secondary | ICD-10-CM | POA: Diagnosis not present

## 2020-10-28 DIAGNOSIS — L814 Other melanin hyperpigmentation: Secondary | ICD-10-CM | POA: Diagnosis not present

## 2020-10-28 DIAGNOSIS — X32XXXA Exposure to sunlight, initial encounter: Secondary | ICD-10-CM | POA: Diagnosis not present

## 2020-11-11 ENCOUNTER — Other Ambulatory Visit: Payer: Self-pay | Admitting: Internal Medicine

## 2020-11-11 DIAGNOSIS — E782 Mixed hyperlipidemia: Secondary | ICD-10-CM | POA: Diagnosis not present

## 2020-11-11 DIAGNOSIS — Z1231 Encounter for screening mammogram for malignant neoplasm of breast: Secondary | ICD-10-CM

## 2020-11-11 DIAGNOSIS — I10 Essential (primary) hypertension: Secondary | ICD-10-CM | POA: Diagnosis not present

## 2020-11-11 DIAGNOSIS — C499 Malignant neoplasm of connective and soft tissue, unspecified: Secondary | ICD-10-CM | POA: Diagnosis not present

## 2020-11-11 DIAGNOSIS — Z0000 Encounter for general adult medical examination without abnormal findings: Secondary | ICD-10-CM | POA: Diagnosis not present

## 2020-12-02 ENCOUNTER — Other Ambulatory Visit: Payer: PPO

## 2020-12-02 DIAGNOSIS — Z20822 Contact with and (suspected) exposure to covid-19: Secondary | ICD-10-CM

## 2020-12-03 LAB — SARS-COV-2, NAA 2 DAY TAT

## 2020-12-03 LAB — NOVEL CORONAVIRUS, NAA: SARS-CoV-2, NAA: NOT DETECTED

## 2020-12-17 DIAGNOSIS — C499 Malignant neoplasm of connective and soft tissue, unspecified: Secondary | ICD-10-CM | POA: Diagnosis not present

## 2020-12-19 ENCOUNTER — Ambulatory Visit
Admission: RE | Admit: 2020-12-19 | Discharge: 2020-12-19 | Disposition: A | Discharge status: Home / Self Care | Payer: PPO | Source: Ambulatory Visit | Attending: Internal Medicine | Admitting: Internal Medicine

## 2020-12-19 ENCOUNTER — Other Ambulatory Visit: Payer: Self-pay

## 2020-12-19 DIAGNOSIS — Z1231 Encounter for screening mammogram for malignant neoplasm of breast: Secondary | ICD-10-CM | POA: Insufficient documentation

## 2021-01-29 DIAGNOSIS — C4361 Malignant melanoma of right upper limb, including shoulder: Secondary | ICD-10-CM | POA: Diagnosis not present

## 2021-02-24 DIAGNOSIS — R0602 Shortness of breath: Secondary | ICD-10-CM | POA: Diagnosis not present

## 2021-02-24 DIAGNOSIS — I10 Essential (primary) hypertension: Secondary | ICD-10-CM | POA: Diagnosis not present

## 2021-02-24 DIAGNOSIS — I2510 Atherosclerotic heart disease of native coronary artery without angina pectoris: Secondary | ICD-10-CM | POA: Diagnosis not present

## 2021-02-24 DIAGNOSIS — E782 Mixed hyperlipidemia: Secondary | ICD-10-CM | POA: Diagnosis not present

## 2021-03-27 ENCOUNTER — Ambulatory Visit: Payer: PPO | Admitting: Gastroenterology

## 2021-04-03 DIAGNOSIS — E782 Mixed hyperlipidemia: Secondary | ICD-10-CM | POA: Diagnosis not present

## 2021-04-03 DIAGNOSIS — J069 Acute upper respiratory infection, unspecified: Secondary | ICD-10-CM | POA: Diagnosis not present

## 2021-04-03 DIAGNOSIS — J3089 Other allergic rhinitis: Secondary | ICD-10-CM | POA: Diagnosis not present

## 2021-04-03 DIAGNOSIS — I10 Essential (primary) hypertension: Secondary | ICD-10-CM | POA: Diagnosis not present

## 2021-04-30 DIAGNOSIS — D2271 Melanocytic nevi of right lower limb, including hip: Secondary | ICD-10-CM | POA: Diagnosis not present

## 2021-04-30 DIAGNOSIS — L570 Actinic keratosis: Secondary | ICD-10-CM | POA: Diagnosis not present

## 2021-04-30 DIAGNOSIS — D2262 Melanocytic nevi of left upper limb, including shoulder: Secondary | ICD-10-CM | POA: Diagnosis not present

## 2021-04-30 DIAGNOSIS — X32XXXA Exposure to sunlight, initial encounter: Secondary | ICD-10-CM | POA: Diagnosis not present

## 2021-04-30 DIAGNOSIS — D2261 Melanocytic nevi of right upper limb, including shoulder: Secondary | ICD-10-CM | POA: Diagnosis not present

## 2021-04-30 DIAGNOSIS — Z85820 Personal history of malignant melanoma of skin: Secondary | ICD-10-CM | POA: Diagnosis not present

## 2021-04-30 DIAGNOSIS — D225 Melanocytic nevi of trunk: Secondary | ICD-10-CM | POA: Diagnosis not present

## 2021-05-12 DIAGNOSIS — I10 Essential (primary) hypertension: Secondary | ICD-10-CM | POA: Diagnosis not present

## 2021-05-12 DIAGNOSIS — D513 Other dietary vitamin B12 deficiency anemia: Secondary | ICD-10-CM | POA: Diagnosis not present

## 2021-05-12 DIAGNOSIS — E782 Mixed hyperlipidemia: Secondary | ICD-10-CM | POA: Diagnosis not present

## 2021-05-12 DIAGNOSIS — M8589 Other specified disorders of bone density and structure, multiple sites: Secondary | ICD-10-CM | POA: Diagnosis not present

## 2021-05-27 DIAGNOSIS — R29890 Loss of height: Secondary | ICD-10-CM | POA: Diagnosis not present

## 2021-05-27 DIAGNOSIS — N959 Unspecified menopausal and perimenopausal disorder: Secondary | ICD-10-CM | POA: Diagnosis not present

## 2021-06-26 DIAGNOSIS — R0602 Shortness of breath: Secondary | ICD-10-CM | POA: Diagnosis not present

## 2021-06-26 DIAGNOSIS — I340 Nonrheumatic mitral (valve) insufficiency: Secondary | ICD-10-CM | POA: Diagnosis not present

## 2021-06-26 DIAGNOSIS — E669 Obesity, unspecified: Secondary | ICD-10-CM | POA: Diagnosis not present

## 2021-06-26 DIAGNOSIS — E782 Mixed hyperlipidemia: Secondary | ICD-10-CM | POA: Diagnosis not present

## 2021-06-26 DIAGNOSIS — I351 Nonrheumatic aortic (valve) insufficiency: Secondary | ICD-10-CM | POA: Diagnosis not present

## 2021-06-26 DIAGNOSIS — I2510 Atherosclerotic heart disease of native coronary artery without angina pectoris: Secondary | ICD-10-CM | POA: Diagnosis not present

## 2021-06-26 DIAGNOSIS — I10 Essential (primary) hypertension: Secondary | ICD-10-CM | POA: Diagnosis not present

## 2021-06-26 DIAGNOSIS — S40861A Insect bite (nonvenomous) of right upper arm, initial encounter: Secondary | ICD-10-CM | POA: Diagnosis not present

## 2021-07-03 DIAGNOSIS — I10 Essential (primary) hypertension: Secondary | ICD-10-CM | POA: Diagnosis not present

## 2021-07-03 DIAGNOSIS — E782 Mixed hyperlipidemia: Secondary | ICD-10-CM | POA: Diagnosis not present

## 2021-07-03 DIAGNOSIS — S40861D Insect bite (nonvenomous) of right upper arm, subsequent encounter: Secondary | ICD-10-CM | POA: Diagnosis not present

## 2021-07-07 DIAGNOSIS — R0602 Shortness of breath: Secondary | ICD-10-CM | POA: Diagnosis not present

## 2021-07-10 DIAGNOSIS — E669 Obesity, unspecified: Secondary | ICD-10-CM | POA: Diagnosis not present

## 2021-07-10 DIAGNOSIS — E782 Mixed hyperlipidemia: Secondary | ICD-10-CM | POA: Diagnosis not present

## 2021-07-10 DIAGNOSIS — I10 Essential (primary) hypertension: Secondary | ICD-10-CM | POA: Diagnosis not present

## 2021-07-10 DIAGNOSIS — R0602 Shortness of breath: Secondary | ICD-10-CM | POA: Diagnosis not present

## 2021-07-10 DIAGNOSIS — R0600 Dyspnea, unspecified: Secondary | ICD-10-CM | POA: Diagnosis not present

## 2021-07-30 DIAGNOSIS — C4361 Malignant melanoma of right upper limb, including shoulder: Secondary | ICD-10-CM | POA: Diagnosis not present

## 2021-08-18 DIAGNOSIS — H2513 Age-related nuclear cataract, bilateral: Secondary | ICD-10-CM | POA: Diagnosis not present

## 2021-11-05 DIAGNOSIS — D2272 Melanocytic nevi of left lower limb, including hip: Secondary | ICD-10-CM | POA: Diagnosis not present

## 2021-11-05 DIAGNOSIS — L814 Other melanin hyperpigmentation: Secondary | ICD-10-CM | POA: Diagnosis not present

## 2021-11-05 DIAGNOSIS — D2262 Melanocytic nevi of left upper limb, including shoulder: Secondary | ICD-10-CM | POA: Diagnosis not present

## 2021-11-05 DIAGNOSIS — L821 Other seborrheic keratosis: Secondary | ICD-10-CM | POA: Diagnosis not present

## 2021-11-05 DIAGNOSIS — X32XXXA Exposure to sunlight, initial encounter: Secondary | ICD-10-CM | POA: Diagnosis not present

## 2021-11-05 DIAGNOSIS — D2271 Melanocytic nevi of right lower limb, including hip: Secondary | ICD-10-CM | POA: Diagnosis not present

## 2021-11-05 DIAGNOSIS — L570 Actinic keratosis: Secondary | ICD-10-CM | POA: Diagnosis not present

## 2021-11-05 DIAGNOSIS — D225 Melanocytic nevi of trunk: Secondary | ICD-10-CM | POA: Diagnosis not present

## 2021-11-05 DIAGNOSIS — D2261 Melanocytic nevi of right upper limb, including shoulder: Secondary | ICD-10-CM | POA: Diagnosis not present

## 2021-11-06 ENCOUNTER — Other Ambulatory Visit: Payer: Self-pay | Admitting: Internal Medicine

## 2021-11-06 DIAGNOSIS — Z1231 Encounter for screening mammogram for malignant neoplasm of breast: Secondary | ICD-10-CM

## 2021-11-11 DIAGNOSIS — E669 Obesity, unspecified: Secondary | ICD-10-CM | POA: Diagnosis not present

## 2021-11-11 DIAGNOSIS — K429 Umbilical hernia without obstruction or gangrene: Secondary | ICD-10-CM | POA: Diagnosis not present

## 2021-11-11 DIAGNOSIS — I340 Nonrheumatic mitral (valve) insufficiency: Secondary | ICD-10-CM | POA: Diagnosis not present

## 2021-11-11 DIAGNOSIS — I10 Essential (primary) hypertension: Secondary | ICD-10-CM | POA: Diagnosis not present

## 2021-11-11 DIAGNOSIS — R0602 Shortness of breath: Secondary | ICD-10-CM | POA: Diagnosis not present

## 2021-11-11 DIAGNOSIS — E782 Mixed hyperlipidemia: Secondary | ICD-10-CM | POA: Diagnosis not present

## 2021-12-15 DIAGNOSIS — E782 Mixed hyperlipidemia: Secondary | ICD-10-CM | POA: Diagnosis not present

## 2021-12-15 DIAGNOSIS — I10 Essential (primary) hypertension: Secondary | ICD-10-CM | POA: Diagnosis not present

## 2021-12-15 DIAGNOSIS — I2510 Atherosclerotic heart disease of native coronary artery without angina pectoris: Secondary | ICD-10-CM | POA: Diagnosis not present

## 2021-12-15 DIAGNOSIS — I340 Nonrheumatic mitral (valve) insufficiency: Secondary | ICD-10-CM | POA: Diagnosis not present

## 2021-12-15 DIAGNOSIS — E669 Obesity, unspecified: Secondary | ICD-10-CM | POA: Diagnosis not present

## 2021-12-22 ENCOUNTER — Ambulatory Visit
Admission: RE | Admit: 2021-12-22 | Discharge: 2021-12-22 | Disposition: A | Discharge status: Home / Self Care | Payer: PPO | Source: Ambulatory Visit | Attending: Internal Medicine | Admitting: Internal Medicine

## 2021-12-22 ENCOUNTER — Other Ambulatory Visit: Payer: Self-pay

## 2021-12-22 DIAGNOSIS — Z1231 Encounter for screening mammogram for malignant neoplasm of breast: Secondary | ICD-10-CM | POA: Diagnosis not present

## 2022-02-13 DIAGNOSIS — R0602 Shortness of breath: Secondary | ICD-10-CM | POA: Diagnosis not present

## 2022-02-13 DIAGNOSIS — E669 Obesity, unspecified: Secondary | ICD-10-CM | POA: Diagnosis not present

## 2022-02-13 DIAGNOSIS — I10 Essential (primary) hypertension: Secondary | ICD-10-CM | POA: Diagnosis not present

## 2022-02-13 DIAGNOSIS — I340 Nonrheumatic mitral (valve) insufficiency: Secondary | ICD-10-CM | POA: Diagnosis not present

## 2022-02-13 DIAGNOSIS — E782 Mixed hyperlipidemia: Secondary | ICD-10-CM | POA: Diagnosis not present

## 2022-02-24 DIAGNOSIS — L820 Inflamed seborrheic keratosis: Secondary | ICD-10-CM | POA: Diagnosis not present

## 2022-02-24 DIAGNOSIS — L570 Actinic keratosis: Secondary | ICD-10-CM | POA: Diagnosis not present

## 2022-02-24 DIAGNOSIS — L308 Other specified dermatitis: Secondary | ICD-10-CM | POA: Diagnosis not present

## 2022-02-24 DIAGNOSIS — D225 Melanocytic nevi of trunk: Secondary | ICD-10-CM | POA: Diagnosis not present

## 2022-02-24 DIAGNOSIS — D2272 Melanocytic nevi of left lower limb, including hip: Secondary | ICD-10-CM | POA: Diagnosis not present

## 2022-02-24 DIAGNOSIS — D2262 Melanocytic nevi of left upper limb, including shoulder: Secondary | ICD-10-CM | POA: Diagnosis not present

## 2022-02-24 DIAGNOSIS — Z85820 Personal history of malignant melanoma of skin: Secondary | ICD-10-CM | POA: Diagnosis not present

## 2022-05-12 DIAGNOSIS — I10 Essential (primary) hypertension: Secondary | ICD-10-CM | POA: Diagnosis not present

## 2022-05-12 DIAGNOSIS — M169 Osteoarthritis of hip, unspecified: Secondary | ICD-10-CM | POA: Diagnosis not present

## 2022-05-12 DIAGNOSIS — E782 Mixed hyperlipidemia: Secondary | ICD-10-CM | POA: Diagnosis not present

## 2022-05-26 DIAGNOSIS — R102 Pelvic and perineal pain: Secondary | ICD-10-CM | POA: Diagnosis not present

## 2022-05-26 DIAGNOSIS — I10 Essential (primary) hypertension: Secondary | ICD-10-CM | POA: Diagnosis not present

## 2022-05-26 DIAGNOSIS — E782 Mixed hyperlipidemia: Secondary | ICD-10-CM | POA: Diagnosis not present

## 2022-06-15 DIAGNOSIS — I340 Nonrheumatic mitral (valve) insufficiency: Secondary | ICD-10-CM | POA: Diagnosis not present

## 2022-06-15 DIAGNOSIS — I10 Essential (primary) hypertension: Secondary | ICD-10-CM | POA: Diagnosis not present

## 2022-06-15 DIAGNOSIS — E669 Obesity, unspecified: Secondary | ICD-10-CM | POA: Diagnosis not present

## 2022-06-15 DIAGNOSIS — E782 Mixed hyperlipidemia: Secondary | ICD-10-CM | POA: Diagnosis not present

## 2022-06-15 DIAGNOSIS — I2510 Atherosclerotic heart disease of native coronary artery without angina pectoris: Secondary | ICD-10-CM | POA: Diagnosis not present

## 2022-06-16 ENCOUNTER — Other Ambulatory Visit: Payer: Self-pay | Admitting: Internal Medicine

## 2022-06-16 DIAGNOSIS — R102 Pelvic and perineal pain: Secondary | ICD-10-CM

## 2022-06-25 ENCOUNTER — Ambulatory Visit
Admission: RE | Admit: 2022-06-25 | Discharge: 2022-06-25 | Disposition: A | Discharge status: Home / Self Care | Payer: PPO | Source: Ambulatory Visit | Attending: Internal Medicine | Admitting: Internal Medicine

## 2022-06-25 DIAGNOSIS — N852 Hypertrophy of uterus: Secondary | ICD-10-CM | POA: Diagnosis not present

## 2022-06-25 DIAGNOSIS — R9389 Abnormal findings on diagnostic imaging of other specified body structures: Secondary | ICD-10-CM | POA: Diagnosis not present

## 2022-06-25 DIAGNOSIS — D259 Leiomyoma of uterus, unspecified: Secondary | ICD-10-CM | POA: Diagnosis not present

## 2022-06-25 DIAGNOSIS — R102 Pelvic and perineal pain: Secondary | ICD-10-CM | POA: Insufficient documentation

## 2022-07-29 DIAGNOSIS — C4361 Malignant melanoma of right upper limb, including shoulder: Secondary | ICD-10-CM | POA: Diagnosis not present

## 2022-08-07 ENCOUNTER — Ambulatory Visit: Payer: PPO | Admitting: Obstetrics and Gynecology

## 2022-08-07 ENCOUNTER — Encounter: Payer: Self-pay | Admitting: Obstetrics and Gynecology

## 2022-08-07 ENCOUNTER — Other Ambulatory Visit (HOSPITAL_COMMUNITY)
Admission: RE | Admit: 2022-08-07 | Discharge: 2022-08-07 | Disposition: A | Discharge status: Home / Self Care | Payer: PPO | Source: Ambulatory Visit | Attending: Obstetrics and Gynecology | Admitting: Obstetrics and Gynecology

## 2022-08-07 VITALS — BP 130/74 | Ht 65.0 in | Wt 194.4 lb

## 2022-08-07 DIAGNOSIS — Z7689 Persons encountering health services in other specified circumstances: Secondary | ICD-10-CM | POA: Diagnosis not present

## 2022-08-07 DIAGNOSIS — R9389 Abnormal findings on diagnostic imaging of other specified body structures: Secondary | ICD-10-CM | POA: Diagnosis not present

## 2022-08-07 DIAGNOSIS — N840 Polyp of corpus uteri: Secondary | ICD-10-CM | POA: Diagnosis not present

## 2022-08-07 NOTE — Progress Notes (Signed)
Patient presents today to discuss pelvic pain, thickened endometrium and fibroids. She denies any postmenopausal bleeding and only having a sharp pain once. No other concerns at this time.

## 2022-08-07 NOTE — Progress Notes (Signed)
HPI:      Ms. Cassandra Miller is a 71 y.o. [phone] who LMP was No LMP recorded. Patient is postmenopausal.  Subjective:   She presents today because she underwent pelvic ultrasound which showed a very thickened endometrium.  She is in menopause and has had no postmenopausal bleeding.    Hx: The following portions of the patient's history were reviewed and updated as appropriate:             She  has a past medical history of Hyperlipemia, Hypertension, and Skin cancer. She does not have any pertinent problems on file. She  has a past surgical history that includes Cesarean section; Skin cancer excision; Lymph node dissection; and Colonoscopy with propofol (N/A, 11/17/2019). Her family history includes Breast cancer in her paternal aunt and paternal grandmother. She  reports that she has never smoked. She has never used smokeless tobacco. She reports current alcohol use. She reports that she does not use drugs. She has a current medication list which includes the following prescription(s): amlodipine, furosemide, hydralazine, multivitamin, olmesartan, simvastatin, and spironolactone. She is allergic to iodinated contrast media.       Review of Systems:  Review of Systems  Constitutional: Denied constitutional symptoms, night sweats, recent illness, fatigue, fever, insomnia and weight loss.  Eyes: Denied eye symptoms, eye pain, photophobia, vision change and visual disturbance.  Ears/Nose/Throat/Neck: Denied ear, nose, throat or neck symptoms, hearing loss, nasal discharge, sinus congestion and sore throat.  Cardiovascular: Denied cardiovascular symptoms, arrhythmia, chest pain/pressure, edema, exercise intolerance, orthopnea and palpitations.  Respiratory: Denied pulmonary symptoms, asthma, pleuritic pain, productive sputum, cough, dyspnea and wheezing.  Gastrointestinal: Denied, gastro-esophageal reflux, melena, nausea and vomiting.  Genitourinary: Denied genitourinary symptoms including  symptomatic vaginal discharge, pelvic relaxation issues, and urinary complaints.  Musculoskeletal: Denied musculoskeletal symptoms, stiffness, swelling, muscle weakness and myalgia.  Dermatologic: Denied dermatology symptoms, rash and scar.  Neurologic: Denied neurology symptoms, dizziness, headache, neck pain and syncope.  Psychiatric: Denied psychiatric symptoms, anxiety and depression.  Endocrine: Denied endocrine symptoms including hot flashes and night sweats.   Meds:   Current Outpatient Medications on File Prior to Visit  Medication Sig Dispense Refill   amLODipine (NORVASC) 5 MG tablet Take 5 mg by mouth daily.  3   furosemide (LASIX) 20 MG tablet Take 20 mg by mouth.     hydrALAZINE (APRESOLINE) 50 MG tablet Take 50 mg by mouth every morning.     Multiple Vitamin (MULTIVITAMIN) capsule Take 1 capsule by mouth daily.     olmesartan (BENICAR) 40 MG tablet Take 40 mg by mouth daily.     simvastatin (ZOCOR) 20 MG tablet      spironolactone (ALDACTONE) 25 MG tablet Take 25 mg by mouth daily.     No current facility-administered medications on file prior to visit.      Objective:     Vitals:   08/07/22 1106  BP: 130/74   Filed Weights   08/07/22 1106  Weight: 194 lb 6.4 oz (88.2 kg)              Ultrasound showing 23 mm endometrium in menopause.  Physical examination   Pelvic:   Vulva: Normal appearance.  No lesions.  Vagina: No lesions or abnormalities noted.  Support: Normal pelvic support.  Urethra No masses tenderness or scarring.  Meatus Normal size without lesions or prolapse.  Cervix: Normal appearance.  No lesions.  Anus: Normal exam.  No lesions.  Perineum: Normal exam.  No lesions.  Bimanual   Uterus: Normal size.  Non-tender.  Mobile.  AV.  Adnexae: No masses.  Non-tender to palpation.  Cul-de-sac: Negative for abnormality.   Endometrial Biopsy After discussion with the patient regarding her abnormal uterine bleeding I recommended that she  proceed with an endometrial biopsy for further diagnosis. The risks, benefits, alternatives, and indications for an endometrial biopsy were discussed with the patient in detail. She understood the risks including infection, bleeding, cervical laceration and uterine perforation.  Verbal consent was obtained.   PROCEDURE NOTE:  Vacurette endometrial biopsy was performed using aseptic technique with iodine preparation.  The uterus was sounded to a length of 8 cm.  Adequate sampling was obtained with minimal blood loss.  The patient tolerated the procedure well.  Disposition will be pending pathology           Assessment:    G3P3003 Patient Active Problem List   Diagnosis Date Noted   Encounter for screening colonoscopy    Hypertension 02/17/2017   Hypercholesteremia 02/17/2017     1. Establishing care with new doctor, encounter for   2. Endometrial thickening on ultrasound        Plan:            1.  Endometrial hyperplasia discussed.  Simple versus complex versus endometrial cancer discussed.  Possible treatment measures or surgery discussed.  Will await endometrial biopsy findings to decide upon management.  Orders No orders of the defined types were placed in this encounter.   No orders of the defined types were placed in this encounter.     F/U  Return in about 3 weeks (around 08/28/2022). I spent 22 minutes involved in the care of this patient preparing to see the patient by obtaining and reviewing her medical history (including labs, imaging tests and prior procedures), documenting clinical information in the electronic health record (EHR), counseling and coordinating care plans, writing and sending prescriptions, ordering tests or procedures and in direct communicating with the patient and medical staff discussing pertinent items from her history and physical exam.  Finis Bud, M.D. 08/07/2022 11:28 AM

## 2022-08-11 ENCOUNTER — Encounter: Payer: Self-pay | Admitting: Obstetrics and Gynecology

## 2022-08-11 LAB — SURGICAL PATHOLOGY

## 2022-08-28 ENCOUNTER — Encounter: Payer: Self-pay | Admitting: Obstetrics and Gynecology

## 2022-08-28 ENCOUNTER — Ambulatory Visit (INDEPENDENT_AMBULATORY_CARE_PROVIDER_SITE_OTHER): Payer: PPO | Admitting: Obstetrics and Gynecology

## 2022-08-28 VITALS — BP 120/80 | Ht 65.0 in | Wt 196.6 lb

## 2022-08-28 DIAGNOSIS — R9389 Abnormal findings on diagnostic imaging of other specified body structures: Secondary | ICD-10-CM

## 2022-08-28 NOTE — Progress Notes (Signed)
HPI:      Cassandra Miller is a 71 y.o. [phone] who LMP was No LMP recorded. Patient is postmenopausal.  Subjective:   She presents today for follow-up of her endometrial biopsy.  She was initially shown to have a very thickened endometrium by ultrasound.  She was not having any postmenopausal bleeding.  She underwent endometrial biopsy and tissue was obtained but it was not the amount expected for a very thickened endometrium.    Hx: The following portions of the patient's history were reviewed and updated as appropriate:             She  has a past medical history of Hyperlipemia, Hypertension, and Skin cancer. She does not have any pertinent problems on file. She  has a past surgical history that includes Cesarean section; Skin cancer excision; Lymph node dissection; and Colonoscopy with propofol (N/A, 11/17/2019). Her family history includes Breast cancer in her paternal aunt and paternal grandmother. She  reports that she has never smoked. She has never used smokeless tobacco. She reports current alcohol use. She reports that she does not use drugs. She has a current medication list which includes the following prescription(s): amlodipine, furosemide, hydralazine, multivitamin, olmesartan, simvastatin, and spironolactone. She is allergic to iodinated contrast media.       Review of Systems:  Review of Systems  Constitutional: Denied constitutional symptoms, night sweats, recent illness, fatigue, fever, insomnia and weight loss.  Eyes: Denied eye symptoms, eye pain, photophobia, vision change and visual disturbance.  Ears/Nose/Throat/Neck: Denied ear, nose, throat or neck symptoms, hearing loss, nasal discharge, sinus congestion and sore throat.  Cardiovascular: Denied cardiovascular symptoms, arrhythmia, chest pain/pressure, edema, exercise intolerance, orthopnea and palpitations.  Respiratory: Denied pulmonary symptoms, asthma, pleuritic pain, productive sputum, cough, dyspnea and  wheezing.  Gastrointestinal: Denied, gastro-esophageal reflux, melena, nausea and vomiting.  Genitourinary: Denied genitourinary symptoms including symptomatic vaginal discharge, pelvic relaxation issues, and urinary complaints.  Musculoskeletal: Denied musculoskeletal symptoms, stiffness, swelling, muscle weakness and myalgia.  Dermatologic: Denied dermatology symptoms, rash and scar.  Neurologic: Denied neurology symptoms, dizziness, headache, neck pain and syncope.  Psychiatric: Denied psychiatric symptoms, anxiety and depression.  Endocrine: Denied endocrine symptoms including hot flashes and night sweats.   Meds:   Current Outpatient Medications on File Prior to Visit  Medication Sig Dispense Refill   amLODipine (NORVASC) 5 MG tablet Take 5 mg by mouth daily.  3   furosemide (LASIX) 20 MG tablet Take 20 mg by mouth.     hydrALAZINE (APRESOLINE) 50 MG tablet Take 50 mg by mouth every morning.     Multiple Vitamin (MULTIVITAMIN) capsule Take 1 capsule by mouth daily.     olmesartan (BENICAR) 40 MG tablet Take 40 mg by mouth daily.     simvastatin (ZOCOR) 20 MG tablet      spironolactone (ALDACTONE) 25 MG tablet Take 25 mg by mouth daily.     No current facility-administered medications on file prior to visit.      Objective:     Vitals:   08/28/22 1008  BP: 120/80   Filed Weights   08/28/22 1008  Weight: 196 lb 9.6 oz (89.2 kg)                        Assessment:    G3P3003 Patient Active Problem List   Diagnosis Date Noted   Encounter for screening colonoscopy    Hypertension 02/17/2017   Hypercholesteremia 02/17/2017     1. Endometrial  thickening on ultrasound     Endometrial thickening on ultrasound shown to be benign endometrial polyp by endometrial biopsy. Patient has no postmenopausal bleeding.   Plan:            1.  We have discussed the benign nature of the polyp.  I explained to her that no medication will remove the polyp.  I explained to her  that typically we perform D&C to remove polyps.  The fact that this is a benign polyp she is not having any bleeding or other symptoms makes management more of a challenge.  At this time she has chosen not to have anything done about the polyp.  She understands that if she has any bleeding that she would need to inform us and would likely undergo a D&C at that time. All questions were answered.  She plans to follow-up at her normal physician's office for annual examinations. Orders No orders of the defined types were placed in this encounter.   No orders of the defined types were placed in this encounter.     F/U  No follow-ups on file. I spent 22 minutes involved in the care of this patient preparing to see the patient by obtaining and reviewing her medical history (including labs, imaging tests and prior procedures), documenting clinical information in the electronic health record (EHR), counseling and coordinating care plans, writing and sending prescriptions, ordering tests or procedures and in direct communicating with the patient and medical staff discussing pertinent items from her history and physical exam.  Finis Bud, M.D. 08/28/2022 10:41 AM

## 2022-08-28 NOTE — Progress Notes (Signed)
Patient presents today for endometrial biopsy follow-up. She states she still has had no bleeding. No other concerns.

## 2022-09-03 DIAGNOSIS — E782 Mixed hyperlipidemia: Secondary | ICD-10-CM | POA: Diagnosis not present

## 2022-09-03 DIAGNOSIS — I10 Essential (primary) hypertension: Secondary | ICD-10-CM | POA: Diagnosis not present

## 2022-09-03 DIAGNOSIS — E668 Other obesity: Secondary | ICD-10-CM | POA: Diagnosis not present

## 2022-09-03 DIAGNOSIS — R7301 Impaired fasting glucose: Secondary | ICD-10-CM | POA: Diagnosis not present

## 2022-09-03 DIAGNOSIS — E669 Obesity, unspecified: Secondary | ICD-10-CM | POA: Diagnosis not present

## 2022-09-17 DIAGNOSIS — S80861A Insect bite (nonvenomous), right lower leg, initial encounter: Secondary | ICD-10-CM | POA: Diagnosis not present

## 2022-09-17 DIAGNOSIS — D2371 Other benign neoplasm of skin of right lower limb, including hip: Secondary | ICD-10-CM | POA: Diagnosis not present

## 2022-09-17 DIAGNOSIS — L821 Other seborrheic keratosis: Secondary | ICD-10-CM | POA: Diagnosis not present

## 2022-09-17 DIAGNOSIS — S80862A Insect bite (nonvenomous), left lower leg, initial encounter: Secondary | ICD-10-CM | POA: Diagnosis not present

## 2022-11-17 DIAGNOSIS — E782 Mixed hyperlipidemia: Secondary | ICD-10-CM | POA: Diagnosis not present

## 2022-11-17 DIAGNOSIS — I340 Nonrheumatic mitral (valve) insufficiency: Secondary | ICD-10-CM | POA: Diagnosis not present

## 2022-11-17 DIAGNOSIS — E669 Obesity, unspecified: Secondary | ICD-10-CM | POA: Diagnosis not present

## 2022-11-17 DIAGNOSIS — I10 Essential (primary) hypertension: Secondary | ICD-10-CM | POA: Diagnosis not present

## 2022-11-20 ENCOUNTER — Other Ambulatory Visit: Payer: Self-pay | Admitting: Internal Medicine

## 2022-11-20 DIAGNOSIS — Z1231 Encounter for screening mammogram for malignant neoplasm of breast: Secondary | ICD-10-CM

## 2022-12-24 ENCOUNTER — Ambulatory Visit
Admission: RE | Admit: 2022-12-24 | Discharge: 2022-12-24 | Disposition: A | Discharge status: Home / Self Care | Payer: PPO | Source: Ambulatory Visit | Attending: Internal Medicine | Admitting: Internal Medicine

## 2022-12-24 DIAGNOSIS — Z1231 Encounter for screening mammogram for malignant neoplasm of breast: Secondary | ICD-10-CM | POA: Insufficient documentation

## 2023-01-05 ENCOUNTER — Other Ambulatory Visit: Payer: Self-pay | Admitting: Internal Medicine

## 2023-01-05 DIAGNOSIS — E559 Vitamin D deficiency, unspecified: Secondary | ICD-10-CM | POA: Diagnosis not present

## 2023-01-05 DIAGNOSIS — R7302 Impaired glucose tolerance (oral): Secondary | ICD-10-CM | POA: Diagnosis not present

## 2023-01-05 DIAGNOSIS — E782 Mixed hyperlipidemia: Secondary | ICD-10-CM | POA: Diagnosis not present

## 2023-01-05 DIAGNOSIS — I10 Essential (primary) hypertension: Secondary | ICD-10-CM | POA: Diagnosis not present

## 2023-01-06 LAB — CMP14+EGFR
ALT: 27 IU/L (ref 0–32)
AST: 33 IU/L (ref 0–40)
Albumin/Globulin Ratio: 1.7 (ref 1.2–2.2)
Albumin: 4.2 g/dL (ref 3.8–4.8)
Alkaline Phosphatase: 86 IU/L (ref 44–121)
BUN/Creatinine Ratio: 12 (ref 12–28)
BUN: 10 mg/dL (ref 8–27)
Bilirubin Total: 0.4 mg/dL (ref 0.0–1.2)
CO2: 17 mmol/L — ABNORMAL LOW (ref 20–29)
Calcium: 10.7 mg/dL — ABNORMAL HIGH (ref 8.7–10.3)
Chloride: 108 mmol/L — ABNORMAL HIGH (ref 96–106)
Creatinine, Ser: 0.86 mg/dL (ref 0.57–1.00)
Globulin, Total: 2.5 g/dL (ref 1.5–4.5)
Glucose: 100 mg/dL — ABNORMAL HIGH (ref 70–99)
Potassium: 4.5 mmol/L (ref 3.5–5.2)
Sodium: 142 mmol/L (ref 134–144)
Total Protein: 6.7 g/dL (ref 6.0–8.5)
eGFR: 72 mL/min/{1.73_m2} (ref >59)

## 2023-01-06 LAB — LIPID PANEL W/O CHOL/HDL RATIO
Cholesterol, Total: 180 mg/dL (ref 100–199)
HDL: 59 mg/dL (ref >39)
LDL Chol Calc (NIH): 92 mg/dL (ref 0–99)
Triglycerides: 167 mg/dL — ABNORMAL HIGH (ref 0–149)
VLDL Cholesterol Cal: 29 mg/dL (ref 5–40)

## 2023-01-06 LAB — VITAMIN D 25 HYDROXY (VIT D DEFICIENCY, FRACTURES): Vit D, 25-Hydroxy: 30.4 ng/mL (ref 30.0–100.0)

## 2023-01-06 LAB — HGB A1C W/O EAG: Hgb A1c MFr Bld: 5.9 % — ABNORMAL HIGH (ref 4.8–5.6)

## 2023-03-19 ENCOUNTER — Encounter: Payer: Self-pay | Admitting: Cardiovascular Disease

## 2023-03-19 ENCOUNTER — Ambulatory Visit: Payer: PPO | Admitting: Cardiovascular Disease

## 2023-03-19 VITALS — BP 130/80 | HR 68 | Ht 65.0 in | Wt 197.2 lb

## 2023-03-19 DIAGNOSIS — I10 Essential (primary) hypertension: Secondary | ICD-10-CM

## 2023-03-19 DIAGNOSIS — E7800 Pure hypercholesterolemia, unspecified: Secondary | ICD-10-CM

## 2023-03-19 DIAGNOSIS — R0789 Other chest pain: Secondary | ICD-10-CM | POA: Insufficient documentation

## 2023-03-19 NOTE — Assessment & Plan Note (Signed)
Controlled. Continue same medications. 

## 2023-03-19 NOTE — Assessment & Plan Note (Signed)
LDL 12/2022 92. Continue simvastatin 20 mg.

## 2023-03-19 NOTE — Assessment & Plan Note (Signed)
Patient reports indigestion sternal area. Sometimes after eating, once after exercising. Will order stress test and echo to check for blockages.

## 2023-03-19 NOTE — Progress Notes (Signed)
Cardiology Office Note   Date:  03/19/2023   ID:  Cassandra Miller, DOB 31-May-1951, MRN [id number]  PCP:  Margaretann Loveless, MD  Cardiologist:  Adrian Blackwater, MD      History of Present Illness: Cassandra Miller is a 72 y.o. female who presents for  Chief Complaint  Patient presents with   Follow-up    4 month follow up    Patient in office for routine cardiac exam. Denies chest pain, shortness of breath, edema, palpitations. Patient complaining of occasional indigestion center of chest.   Chest Pain  This is a new problem. The current episode started 1 to 4 weeks ago. The onset quality is undetermined. The problem occurs intermittently. The problem has been waxing and waning. The pain is present in the substernal region. The pain is mild. The quality of the pain is described as heavy. Risk factors include post-menopausal and obesity.  Her past medical history is significant for hyperlipidemia and hypertension.    Past Medical History:  Diagnosis Date   Hyperlipemia    Hypertension    Skin cancer      Past Surgical History:  Procedure Laterality Date   CESAREAN SECTION     COLONOSCOPY WITH PROPOFOL N/A 11/17/2019   Procedure: COLONOSCOPY WITH PROPOFOL;  Surgeon: Midge Minium, MD;  Location: [facility] ENDOSCOPY;  Service: Endoscopy;  Laterality: N/A;   LYMPH NODE DISSECTION     SKIN CANCER EXCISION       Current Outpatient Medications  Medication Sig Dispense Refill   furosemide (LASIX) 20 MG tablet Take 20 mg by mouth.     hydrALAZINE (APRESOLINE) 50 MG tablet Take 50 mg by mouth every morning.     Multiple Vitamin (MULTIVITAMIN) capsule Take 1 capsule by mouth daily.     olmesartan (BENICAR) 40 MG tablet Take 40 mg by mouth daily.     simvastatin (ZOCOR) 20 MG tablet      spironolactone (ALDACTONE) 25 MG tablet Take 25 mg by mouth daily.     No current facility-administered medications for this visit.    Allergies:   Iodinated contrast media    Social History:   reports  that she has never smoked. She has never used smokeless tobacco. She reports current alcohol use. She reports that she does not use drugs.   Family History:  family history includes Breast cancer in her paternal aunt and paternal grandmother.    ROS:     Review of Systems  Constitutional: Negative.   HENT: Negative.    Eyes: Negative.   Respiratory: Negative.    Cardiovascular:  Positive for chest pain.  Gastrointestinal: Negative.   Genitourinary: Negative.   Musculoskeletal: Negative.   Skin: Negative.   Neurological: Negative.   Endo/Heme/Allergies: Negative.   Psychiatric/Behavioral: Negative.    All other systems reviewed and are negative.   All other systems are reviewed and negative.   PHYSICAL EXAM: VS:  BP 130/80   Pulse 68   Ht 5\' 5"  (1.651 m)   Wt 197 lb 3.2 oz (89.4 kg)   SpO2 98%   BMI 32.82 kg/m  , BMI Body mass index is 32.82 kg/m. Last weight:  Wt Readings from Last 3 Encounters:  03/19/23 197 lb 3.2 oz (89.4 kg)  08/28/22 196 lb 9.6 oz (89.2 kg)  08/07/22 194 lb 6.4 oz (88.2 kg)   Physical Exam Constitutional:      Appearance: Normal appearance.  Cardiovascular:     Rate and Rhythm: Normal rate  and regular rhythm.     Heart sounds: Normal heart sounds.  Pulmonary:     Effort: Pulmonary effort is normal.     Breath sounds: Normal breath sounds.  Musculoskeletal:     Right lower leg: No edema.     Left lower leg: No edema.  Neurological:     Mental Status: She is alert.     EKG: none today  Recent Labs: 01/05/2023: ALT 27; BUN 10; Creatinine, Ser 0.86; Potassium 4.5; Sodium 142    Lipid Panel    Component Value Date/Time   CHOL 180 01/05/2023 0932   TRIG 167 (H) 01/05/2023 0932   HDL 59 01/05/2023 0932   CHOLHDL 2.7 04/28/2018 0809   LDLCALC 92 01/05/2023 0932      Other studies Reviewed: [name] DOB:  1951/05/02  Date:  07/07/2021 13:30 Provider: Adrian Blackwater MD Encounter: ECHO   Page 2 REASON FOR  VISIT  Visit for: Echocardiogram/Shortness of breath  Sex:      female   wt=  196  lbs.  BP= 134/70  Height= 65   inches.   TESTS  Imaging: Echocardiogram:  An echocardiogram in (2-d) mode was performed and in Doppler mode with color flow velocity mapping was performed. The aortic valve cusps are abnormal 1.8  cm, flow velocity 1.8   m/s, and systolic calculated mean flow gradient 6  mmHg. Mitral valve diastolic peak flow velocity E 0.8    m/s and E/A ratio 0.9. Aortic root diameter 3.2   cm. The LVOT internal diameter 1.9  cm and flow velocity was abnormal 1  m/s. LV systolic dimension 2.6   cm, diastolic 4.2 cm, posterior wall thickness 1   cm, fractional shortening 38 %, and EF 65 %. IVS thickness 1  cm. LA dimension 3.6 cm  RIGHT atrium= 13.9   cm2. Mitral Valve =  Ea= 9.9  DT= 236 msec. Tricuspid Valve =  TR jet V=   2.3   RAP=5  RVSP= 27   mmHg. Aortic Valve is Normal. Pulmonic Valve= PIEDV=  0.8    m/s. Mitral Valve has Trace Regurgitation. Pulmonic Valve has Mild Regurgitation. Tricuspid Valve has Mild Regurgitation.   ASSESSMENT  Technically adequate study.  Ejection fraction-65  Left Ventricle- Normal size   Left Ventricle diastolic dysfunction grade-Grade 1 relaxation abnormality  Right Ventricle- Mildly dilated  Normal right ventricular wall motion  Left Atrium-Normal size  Right Atrium-Normal size  Aortic valve-Trivial Aortic valve calcification with No stenosis, No regurgitation  Pulmonic Valve-No stenosis, Mild regurgitation  Mitral Valve-Trace regurgitation  Tricuspid Valve-Mild Regurgitation  No Pericardial effusion.   THERAPY   Referring physician: Laurier Chivon  Sonographer: Lenor Derrick.   Adrian Blackwater MD  Electronically signed by: Adrian Blackwater     Date: 07/07/2021 14:57  [name] DOB:  [birth year]  Date:  07/07/2021 08:00 Provider: Adrian Blackwater MD Encounter: NUCLEAR STRESS TEST   Page 1 TESTS                                                                                  [facility] [address], [address]  [place], [place] [address] [phone] STUDY:  Gated Stress / Rest Myocardial Perfusion Imaging Tomographic (SPECT) Including attenuation correction Wall Motion, Left Ventricular Ejection Fraction By Gated Technique.Treadmill Stress Test. SEX: Female    WEIGHT: 193 lbs   HEIGHT: 65 in     ARMS UP: YES/NO                                                                        REFERRING PHYSICIAN: Dr.Wilhemina Grall Welton Flakes                                                                                                                                                                                                                       INDICATION FOR STUDY: SOB                                                                                                                                                                                                                    TECHNIQUE:  Approximately 20 minutes following the intravenous administration of 10.6 mCi of Tc-89m Sestamibi after stress testing in a reclined supine position with arms above their head if able to do so, gated SPECT imaging of the heart was performed. After about a 2hr break, the  patient was injected intravenously with 31.2 mCi of Tc-84m Sestamibi.  Approximately 45 minutes later in the same position as stress imaging SPECT rest imaging of the heart was performed.  STRESS BY:  Adrian Blackwater, MD PROTOCOL:  Smitty Cords                                                                                        MAX PRED HR: 150                     85%: 128               75%: 113                                                                                                                   RESTING BP: 130/72   RESTING HR: 78  PEAK BP: 162/84  PEAK HR: 136 (90%)                                                                    EXERCISE DURATION:  8:00                                             METS: 9.4     REASON FOR TEST TERMINATION: Target reached/1 min post injection                                                                                                                                  SYMPTOMS: None  [facility] TREADMILL SCORE:  8  RISK: Low                                                                                                                                                                                                           EKG RESULTS:  NSR. 77/min. No significant ST changes at peak exercise.                                                            IMAGE QUALITY: Fair                                                                                                                                                                                                                                                                                                                                  PERFUSION/WALL  MOTION FINDINGS: EF = 84%. No perfusion defects, normal wall motion.                                                                          IMPRESSION: Normal stress test with normal LVEF.                                                                                                                                                                                                                                                                                        Adrian Blackwater, MD Stress Interpreting Physician / Nuclear Interpreting Physician        Adrian Blackwater MD  Electronically signed by: Adrian Blackwater     Date: 07/11/2021 10:06  [name] DOB:  November 30, 1951  Date:  02/24/2013 08:30 Provider: Welton Flakes, MD, Esther Bradstreet Encounter: ALL ANGIOGRAMS (CTA BRAIN, CAROTIDS, RENAL ARTERIES, PE)   Page 2 REASON FOR VISIT  Referred by [facility].   TESTS  Imaging: Computed  Tomographic Angiography:  Cardiac multidetector CT was performed paying particular attention to the coronary arteries for the diagnosis of: Chest pain and SOB. Diagnostic Drugs:  Administered iohexol (Omnipaque) through an antecubital vein and images from the examination were analyzed for the presence and extent of coronary artery disease, using 3D image processing software. 100 mL of non-ionic contrast (Omnipaque) was used.   TEST CONCLUSIONS  1 - Calcium score is 0.  2 - Right dominant system.  3 - Normal coronaries.   Adrian Blackwater, MD  Electronically signed by: Adrian Blackwater     Date: 02/28/2013 11:09   ASSESSMENT AND PLAN:    ICD-10-CM   1. Other chest pain  R07.89 PCV ECHOCARDIOGRAM COMPLETE    MYOCARDIAL PERFUSION IMAGING    2. Primary hypertension  I10     3. Hypercholesteremia  E78.00  Problem List Items Addressed This Visit       Cardiovascular and Mediastinum   Hypertension    Controlled. Continue same medications.         Other   Hypercholesteremia    LDL 12/2022 92. Continue simvastatin 20 mg.       Other chest pain - Primary    Patient reports indigestion sternal area. Sometimes after eating, once after exercising. Will order stress test and echo to check for blockages.       Relevant Orders   PCV ECHOCARDIOGRAM COMPLETE   MYOCARDIAL PERFUSION IMAGING     Disposition:   Return in about 4 weeks (around 04/16/2023) for after stress test, echo.    Total time spent: 30 minutes  Signed,  Adrian Blackwater, MD  03/19/2023 10:05 AM    [facility]

## 2023-03-23 ENCOUNTER — Ambulatory Visit (INDEPENDENT_AMBULATORY_CARE_PROVIDER_SITE_OTHER): Payer: PPO

## 2023-03-23 DIAGNOSIS — R0789 Other chest pain: Secondary | ICD-10-CM

## 2023-03-26 ENCOUNTER — Ambulatory Visit (INDEPENDENT_AMBULATORY_CARE_PROVIDER_SITE_OTHER): Payer: PPO

## 2023-03-26 DIAGNOSIS — R0789 Other chest pain: Secondary | ICD-10-CM

## 2023-03-26 MED ORDER — TECHNETIUM TC 99M SESTAMIBI GENERIC - CARDIOLITE
33.0000 | Freq: Once | INTRAVENOUS | Status: AC | PRN
  Administered 2023-03-26: 33 via INTRAVENOUS

## 2023-03-26 MED ORDER — TECHNETIUM TC 99M SESTAMIBI GENERIC - CARDIOLITE
10.1000 | Freq: Once | INTRAVENOUS | Status: AC | PRN
  Administered 2023-03-26: 10.1 via INTRAVENOUS

## 2023-03-30 DIAGNOSIS — D2262 Melanocytic nevi of left upper limb, including shoulder: Secondary | ICD-10-CM | POA: Diagnosis not present

## 2023-03-30 DIAGNOSIS — D225 Melanocytic nevi of trunk: Secondary | ICD-10-CM | POA: Diagnosis not present

## 2023-03-30 DIAGNOSIS — D485 Neoplasm of uncertain behavior of skin: Secondary | ICD-10-CM | POA: Diagnosis not present

## 2023-03-30 DIAGNOSIS — D034 Melanoma in situ of scalp and neck: Secondary | ICD-10-CM | POA: Diagnosis not present

## 2023-03-30 DIAGNOSIS — Z85820 Personal history of malignant melanoma of skin: Secondary | ICD-10-CM | POA: Diagnosis not present

## 2023-03-30 DIAGNOSIS — D2271 Melanocytic nevi of right lower limb, including hip: Secondary | ICD-10-CM | POA: Diagnosis not present

## 2023-03-30 DIAGNOSIS — L821 Other seborrheic keratosis: Secondary | ICD-10-CM | POA: Diagnosis not present

## 2023-03-30 DIAGNOSIS — D2261 Melanocytic nevi of right upper limb, including shoulder: Secondary | ICD-10-CM | POA: Diagnosis not present

## 2023-04-16 ENCOUNTER — Ambulatory Visit (INDEPENDENT_AMBULATORY_CARE_PROVIDER_SITE_OTHER): Payer: PPO | Admitting: Cardiovascular Disease

## 2023-04-16 ENCOUNTER — Encounter: Payer: Self-pay | Admitting: Cardiovascular Disease

## 2023-04-16 VITALS — BP 130/80 | HR 54 | Ht 65.0 in | Wt 197.2 lb

## 2023-04-16 DIAGNOSIS — R0789 Other chest pain: Secondary | ICD-10-CM | POA: Diagnosis not present

## 2023-04-16 DIAGNOSIS — I10 Essential (primary) hypertension: Secondary | ICD-10-CM | POA: Diagnosis not present

## 2023-04-16 NOTE — Assessment & Plan Note (Signed)
Denies chest pain, stress test normal.

## 2023-04-16 NOTE — Progress Notes (Signed)
Cardiology Office Note   Date:  04/16/2023   ID:  Cassandra Miller, DOB 11/20/1951, MRN [id number]  PCP:  Margaretann Loveless, MD  Cardiologist:  Adrian Blackwater, MD      History of Present Illness: Cassandra Miller is a 72 y.o. female who presents for  Chief Complaint  Patient presents with   Follow-up    1 month follow up, NST and Echo results.    Patient in office to discuss results of echo and stress test. Denies chest pain, shortness of breath.     Past Medical History:  Diagnosis Date   Hyperlipemia    Hypertension    Skin cancer      Past Surgical History:  Procedure Laterality Date   CESAREAN SECTION     COLONOSCOPY WITH PROPOFOL N/A 11/17/2019   Procedure: COLONOSCOPY WITH PROPOFOL;  Surgeon: Midge Minium, MD;  Location: [facility] ENDOSCOPY;  Service: Endoscopy;  Laterality: N/A;   LYMPH NODE DISSECTION     SKIN CANCER EXCISION       Current Outpatient Medications  Medication Sig Dispense Refill   furosemide (LASIX) 20 MG tablet Take 20 mg by mouth.     hydrALAZINE (APRESOLINE) 50 MG tablet Take 50 mg by mouth every morning.     Multiple Vitamin (MULTIVITAMIN) capsule Take 1 capsule by mouth daily.     olmesartan (BENICAR) 40 MG tablet Take 40 mg by mouth daily.     simvastatin (ZOCOR) 20 MG tablet      spironolactone (ALDACTONE) 25 MG tablet Take 25 mg by mouth daily.     No current facility-administered medications for this visit.    Allergies:   Iodinated contrast media    Social History:   reports that she has never smoked. She has never used smokeless tobacco. She reports current alcohol use. She reports that she does not use drugs.   Family History:  family history includes Breast cancer in her paternal aunt and paternal grandmother.    ROS:     Review of Systems  Constitutional: Negative.   HENT: Negative.    Eyes: Negative.   Respiratory: Negative.    Cardiovascular: Negative.   Gastrointestinal: Negative.   Genitourinary: Negative.    Musculoskeletal: Negative.   Skin: Negative.   Neurological: Negative.   Endo/Heme/Allergies: Negative.   Psychiatric/Behavioral: Negative.    All other systems reviewed and are negative.   All other systems are reviewed and negative.   PHYSICAL EXAM: VS:  BP 130/80   Pulse (!) 54   Ht 5\' 5"  (1.651 m)   Wt 197 lb 3.2 oz (89.4 kg)   SpO2 96%   BMI 32.82 kg/m  , BMI Body mass index is 32.82 kg/m. Last weight:  Wt Readings from Last 3 Encounters:  04/16/23 197 lb 3.2 oz (89.4 kg)  03/19/23 197 lb 3.2 oz (89.4 kg)  08/28/22 196 lb 9.6 oz (89.2 kg)    Physical Exam Constitutional:      Appearance: Normal appearance.  Cardiovascular:     Rate and Rhythm: Normal rate and regular rhythm.     Heart sounds: Normal heart sounds.  Pulmonary:     Effort: Pulmonary effort is normal.     Breath sounds: Normal breath sounds.  Musculoskeletal:     Right lower leg: No edema.     Left lower leg: No edema.  Neurological:     Mental Status: She is alert.     EKG: none today  Recent Labs: 01/05/2023: ALT  27; BUN 10; Creatinine, Ser 0.86; Potassium 4.5; Sodium 142    Lipid Panel    Component Value Date/Time   CHOL 180 01/05/2023 0932   TRIG 167 (H) 01/05/2023 0932   HDL 59 01/05/2023 0932   CHOLHDL 2.7 04/28/2018 0809   LDLCALC 92 01/05/2023 0932     Other studies Reviewed: Echo 03/2023, stress test 04/2023  ASSESSMENT AND PLAN:    ICD-10-CM   1. Primary hypertension  I10     2. Other chest pain  R07.89        Problem List Items Addressed This Visit       Cardiovascular and Mediastinum   Hypertension - Primary    Well controlled. Normal EF on recent echo. Continue same medications.         Other   Other chest pain    Denies chest pain, stress test normal.         Disposition:   Return in about 6 months (around 10/17/2023).    Total time spent: 30 minutes  Signed,  Adrian Blackwater, MD  04/16/2023 11:17 AM    [facility]

## 2023-04-16 NOTE — Assessment & Plan Note (Signed)
Well controlled. Normal EF on recent echo. Continue same medications.

## 2023-05-06 ENCOUNTER — Encounter: Payer: Self-pay | Admitting: Internal Medicine

## 2023-05-06 ENCOUNTER — Ambulatory Visit (INDEPENDENT_AMBULATORY_CARE_PROVIDER_SITE_OTHER): Payer: PPO | Admitting: Internal Medicine

## 2023-05-06 VITALS — BP 124/78 | HR 58 | Ht 65.0 in | Wt 194.0 lb

## 2023-05-06 DIAGNOSIS — C4359 Malignant melanoma of other part of trunk: Secondary | ICD-10-CM | POA: Diagnosis not present

## 2023-05-06 DIAGNOSIS — E782 Mixed hyperlipidemia: Secondary | ICD-10-CM | POA: Diagnosis not present

## 2023-05-06 DIAGNOSIS — I10 Essential (primary) hypertension: Secondary | ICD-10-CM

## 2023-05-06 DIAGNOSIS — R7303 Prediabetes: Secondary | ICD-10-CM | POA: Diagnosis not present

## 2023-05-06 NOTE — Progress Notes (Signed)
Established Patient Office Visit  Subjective:  Patient ID: Cassandra Miller, female    DOB: 1951-09-09  Age: 72 y.o. MRN: [id number]  Chief Complaint  Patient presents with   Follow-up    4 month follow up    Patient comes in for her 60-month follow-up.  She is generally feeling well and has been able to lose some weight.  She was recently diagnosed with a recurrence of her melanoma and is being scheduled for surgery.  Biopsy site beneath the left clavicle is healing nicely.  Her first occurrence was near her right shoulder in 2020. She does not have any complaints of nausea vomiting or diarrhea.  She is fasting today for blood work.    No other concerns at this time.   Past Medical History:  Diagnosis Date   Hyperlipemia    Hypertension    Skin cancer     Past Surgical History:  Procedure Laterality Date   CESAREAN SECTION     COLONOSCOPY WITH PROPOFOL N/A 11/17/2019   Procedure: COLONOSCOPY WITH PROPOFOL;  Surgeon: Midge Minium, MD;  Location: [facility] ENDOSCOPY;  Service: Endoscopy;  Laterality: N/A;   LYMPH NODE DISSECTION     SKIN CANCER EXCISION      Social History   Socioeconomic History   Marital status: Married    Spouse name: Not on file   Number of children: Not on file   Years of education: Not on file   Highest education level: Not on file  Occupational History   Not on file  Tobacco Use   Smoking status: Never   Smokeless tobacco: Never  Vaping Use   Vaping Use: Never used  Substance and Sexual Activity   Alcohol use: Yes    Comment: social   Drug use: No   Sexual activity: Not Currently    Birth control/protection: Post-menopausal  Other Topics Concern   Not on file  Social History Narrative   Not on file   Social Determinants of Health   Financial Resource Strain: Not on file  Food Insecurity: Not on file  Transportation Needs: Not on file  Physical Activity: Not on file  Stress: Not on file  Social Connections: Not on file  Intimate Partner  Violence: Not on file    Family History  Problem Relation Age of Onset   Breast cancer Paternal Aunt        62's   Breast cancer Paternal Grandmother        22's    Allergies  Allergen Reactions   Iodinated Contrast Media     Rash    Review of Systems  Constitutional:  Negative for chills, diaphoresis, fever, malaise/fatigue and weight loss.  HENT: Negative.    Eyes: Negative.   Respiratory:  Negative for cough, hemoptysis, shortness of breath and wheezing.   Cardiovascular:  Negative for chest pain, palpitations, orthopnea, claudication, leg swelling and PND.  Gastrointestinal:  Negative for abdominal pain, blood in stool, constipation, diarrhea, heartburn, melena, nausea and vomiting.  Genitourinary: Negative.   Musculoskeletal:  Negative for back pain, falls, joint pain, myalgias and neck pain.  Neurological:  Negative for dizziness, tremors, speech change, focal weakness, seizures, loss of consciousness and weakness.  Psychiatric/Behavioral:  Negative for depression. The patient is not nervous/anxious and does not have insomnia.        Objective:   BP 124/78   Pulse (!) 58   Ht 5\' 5"  (1.651 m)   Wt 194 lb (88 kg)   SpO2  98%   BMI 32.28 kg/m   Vitals:   05/06/23 0914  BP: 124/78  Pulse: (!) 58  Height: 5\' 5"  (1.651 m)  Weight: 194 lb (88 kg)  SpO2: 98%  BMI (Calculated): 32.28    Physical Exam Vitals and nursing note reviewed.  Constitutional:      Appearance: Normal appearance.  HENT:     Head: Normocephalic and atraumatic.     Nose: Nose normal.  Cardiovascular:     Rate and Rhythm: Normal rate and regular rhythm.     Pulses: Normal pulses.     Heart sounds: Normal heart sounds.  Pulmonary:     Effort: Pulmonary effort is normal.     Breath sounds: Normal breath sounds. No wheezing, rhonchi or rales.  Chest:     Chest wall: No tenderness.  Abdominal:     General: Bowel sounds are normal. There is no distension.     Palpations: Abdomen is  soft. There is no mass.     Tenderness: There is no right CVA tenderness, left CVA tenderness, guarding or rebound.  Musculoskeletal:        General: No swelling, deformity or signs of injury. Normal range of motion.     Cervical back: Normal range of motion and neck supple.     Right lower leg: No edema.     Left lower leg: No edema.  Skin:    General: Skin is warm and dry.     Findings: No bruising or lesion.  Neurological:     General: No focal deficit present.     Mental Status: She is alert and oriented to person, place, and time.  Psychiatric:        Mood and Affect: Mood normal.      No results found for any visits on 05/06/23.  No results found for this or any previous visit (from the past 2160 hour(s)).    Assessment & Plan:  Patient's blood pressure is well-controlled now.  She is encouraged to continue diet and and exercise as tolerated.  She will get fasting labs today. 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   Total time spent: 30 minutes  Margaretann Loveless, MD  05/06/2023   This document may have been prepared by [facility] Voice Recognition software and as such may include unintentional dictation errors.

## 2023-05-07 LAB — LIPID PANEL W/O CHOL/HDL RATIO
Cholesterol, Total: 173 mg/dL (ref 100–199)
HDL: 61 mg/dL (ref >39)
LDL Chol Calc (NIH): 92 mg/dL (ref 0–99)
Triglycerides: 111 mg/dL (ref 0–149)
VLDL Cholesterol Cal: 20 mg/dL (ref 5–40)

## 2023-05-07 LAB — CMP14+EGFR
ALT: 32 IU/L (ref 0–32)
AST: 32 IU/L (ref 0–40)
Albumin/Globulin Ratio: 1.9 (ref 1.2–2.2)
Albumin: 4.4 g/dL (ref 3.8–4.8)
Alkaline Phosphatase: 87 IU/L (ref 44–121)
BUN/Creatinine Ratio: 16 (ref 12–28)
BUN: 14 mg/dL (ref 8–27)
Bilirubin Total: 0.4 mg/dL (ref 0.0–1.2)
CO2: 22 mmol/L (ref 20–29)
Calcium: 10.3 mg/dL (ref 8.7–10.3)
Chloride: 105 mmol/L (ref 96–106)
Creatinine, Ser: 0.9 mg/dL (ref 0.57–1.00)
Globulin, Total: 2.3 g/dL (ref 1.5–4.5)
Glucose: 94 mg/dL (ref 70–99)
Potassium: 4.9 mmol/L (ref 3.5–5.2)
Sodium: 141 mmol/L (ref 134–144)
Total Protein: 6.7 g/dL (ref 6.0–8.5)
eGFR: 68 mL/min/{1.73_m2} (ref >59)

## 2023-05-07 LAB — HEMOGLOBIN A1C
Est. average glucose Bld gHb Est-mCnc: 126 mg/dL
Hgb A1c MFr Bld: 6 % — ABNORMAL HIGH (ref 4.8–5.6)

## 2023-05-10 DIAGNOSIS — D034 Melanoma in situ of scalp and neck: Secondary | ICD-10-CM | POA: Diagnosis not present

## 2023-07-19 ENCOUNTER — Other Ambulatory Visit: Payer: Self-pay | Admitting: Internal Medicine

## 2023-07-19 MED ORDER — HYDRALAZINE HCL 50 MG PO TABS: 50.0000 mg | ORAL_TABLET | Freq: Every morning | ORAL | 1 refills | Status: DC

## 2023-07-20 ENCOUNTER — Other Ambulatory Visit: Payer: Self-pay | Admitting: Internal Medicine

## 2023-07-23 ENCOUNTER — Other Ambulatory Visit: Payer: Self-pay | Admitting: Internal Medicine

## 2023-07-23 ENCOUNTER — Other Ambulatory Visit: Payer: Self-pay

## 2023-07-23 ENCOUNTER — Telehealth: Payer: Self-pay

## 2023-07-23 DIAGNOSIS — I10 Essential (primary) hypertension: Secondary | ICD-10-CM

## 2023-07-23 MED ORDER — HYDRALAZINE HCL 50 MG PO TABS
50.0000 mg | ORAL_TABLET | Freq: Two times a day (BID) | ORAL | 3 refills | Status: DC
Start: 2023-07-23 — End: 2023-07-23

## 2023-07-23 MED ORDER — HYDRALAZINE HCL 50 MG PO TABS: 50.0000 mg | ORAL_TABLET | Freq: Two times a day (BID) | ORAL | 3 refills | Status: DC

## 2023-07-23 NOTE — Telephone Encounter (Signed)
Spoke with pt who verbalized understanding.

## 2023-07-26 ENCOUNTER — Other Ambulatory Visit: Payer: Self-pay

## 2023-07-26 ENCOUNTER — Telehealth: Payer: Self-pay | Admitting: Internal Medicine

## 2023-07-26 ENCOUNTER — Telehealth: Payer: Self-pay

## 2023-07-26 MED ORDER — PAXLOVID (300/100) 20 X 150 MG & 10 X 100MG PO TBPK: 3.0000 | ORAL_TABLET | Freq: Two times a day (BID) | ORAL | 0 refills | Status: AC

## 2023-07-26 NOTE — Telephone Encounter (Signed)
Spoke with pt who verbalized understanding and pt confirmed pharmacy, order has been sent off.

## 2023-07-26 NOTE — Telephone Encounter (Signed)
Patient left VM wanting to know if she can start the Paxlovid today even though she took her simvastatin this morning? Please advise.

## 2023-09-07 ENCOUNTER — Encounter: Payer: Self-pay | Admitting: Internal Medicine

## 2023-09-07 ENCOUNTER — Ambulatory Visit: Payer: PPO | Admitting: Internal Medicine

## 2023-09-07 VITALS — BP 132/68 | HR 66 | Ht 65.0 in | Wt 186.4 lb

## 2023-09-07 DIAGNOSIS — C4359 Malignant melanoma of other part of trunk: Secondary | ICD-10-CM | POA: Diagnosis not present

## 2023-09-07 DIAGNOSIS — R7303 Prediabetes: Secondary | ICD-10-CM

## 2023-09-07 DIAGNOSIS — E782 Mixed hyperlipidemia: Secondary | ICD-10-CM | POA: Diagnosis not present

## 2023-09-07 DIAGNOSIS — I10 Essential (primary) hypertension: Secondary | ICD-10-CM | POA: Diagnosis not present

## 2023-09-07 DIAGNOSIS — Z23 Encounter for immunization: Secondary | ICD-10-CM | POA: Diagnosis not present

## 2023-09-07 MED ORDER — HYDRALAZINE HCL 50 MG PO TABS
50.0000 mg | ORAL_TABLET | Freq: Two times a day (BID) | ORAL | 3 refills | Status: DC
Start: 2023-09-07 — End: 2024-08-31

## 2023-09-07 MED ORDER — SIMVASTATIN 20 MG PO TABS
20.0000 mg | ORAL_TABLET | Freq: Every day | ORAL | 3 refills | Status: DC
Start: 2023-09-07 — End: 2023-11-10

## 2023-09-07 MED ORDER — FUROSEMIDE 20 MG PO TABS
ORAL_TABLET | ORAL | 3 refills | Status: DC
Start: 2023-09-07 — End: 2024-08-31

## 2023-09-07 MED ORDER — OLMESARTAN MEDOXOMIL 40 MG PO TABS
40.0000 mg | ORAL_TABLET | Freq: Every day | ORAL | 3 refills | Status: DC
Start: 2023-09-07 — End: 2024-08-31

## 2023-09-07 MED ORDER — SPIRONOLACTONE 25 MG PO TABS
25.0000 mg | ORAL_TABLET | Freq: Every day | ORAL | 3 refills | Status: DC
Start: 2023-09-07 — End: 2024-08-31

## 2023-09-07 NOTE — Progress Notes (Signed)
Established Patient Office Visit  Subjective:  Patient ID: Cassandra Miller, female    DOB: [birth year]  Age: 72 y.o. MRN: [id number]  Chief Complaint  Patient presents with   Follow-up    4 month follow up    Patient is here for her follow-up.  Generally feels well and has no new complaints.  She is working on her diet and exercise and was able to lose some weight.  She has completed her surgical treatment for recurrent melanoma. Fasting today for blood work.    No other concerns at this time.   Past Medical History:  Diagnosis Date   Hyperlipemia    Hypertension    Skin cancer     Past Surgical History:  Procedure Laterality Date   CESAREAN SECTION     COLONOSCOPY WITH PROPOFOL N/A 11/17/2019   Procedure: COLONOSCOPY WITH PROPOFOL;  Surgeon: Midge Minium, MD;  Location: [facility] ENDOSCOPY;  Service: Endoscopy;  Laterality: N/A;   LYMPH NODE DISSECTION     SKIN CANCER EXCISION      Social History   Socioeconomic History   Marital status: Married    Spouse name: Not on file   Number of children: Not on file   Years of education: Not on file   Highest education level: Not on file  Occupational History   Not on file  Tobacco Use   Smoking status: Never   Smokeless tobacco: Never  Vaping Use   Vaping status: Never Used  Substance and Sexual Activity   Alcohol use: Yes    Comment: social   Drug use: No   Sexual activity: Not Currently    Birth control/protection: Post-menopausal  Other Topics Concern   Not on file  Social History Narrative   Not on file   Social Determinants of Health   Financial Resource Strain: Not on file  Food Insecurity: Not on file  Transportation Needs: Not on file  Physical Activity: Not on file  Stress: Not on file  Social Connections: Not on file  Intimate Partner Violence: Not on file    Family History  Problem Relation Age of Onset   Breast cancer Paternal Aunt        54's   Breast cancer Paternal Grandmother        63's     Allergies  Allergen Reactions   Iodinated Contrast Media     Rash    Review of Systems  Constitutional: Negative.  Negative for fever and weight loss.  HENT: Negative.    Eyes: Negative.   Respiratory: Negative.  Negative for cough and shortness of breath.   Cardiovascular: Negative.  Negative for chest pain, palpitations and leg swelling.  Gastrointestinal: Negative.  Negative for abdominal pain, constipation, diarrhea, heartburn, nausea and vomiting.  Genitourinary: Negative.  Negative for dysuria and flank pain.  Musculoskeletal: Negative.  Negative for joint pain and myalgias.  Skin: Negative.   Neurological: Negative.  Negative for dizziness and headaches.  Endo/Heme/Allergies: Negative.   Psychiatric/Behavioral: Negative.  Negative for depression and suicidal ideas. The patient is not nervous/anxious.        Objective:   BP 132/68   Pulse 66   Ht 5\' 5"  (1.651 m)   Wt 186 lb 6.4 oz (84.6 kg)   SpO2 95%   BMI 31.02 kg/m   Vitals:   09/07/23 0854  BP: 132/68  Pulse: 66  Height: 5\' 5"  (1.651 m)  Weight: 186 lb 6.4 oz (84.6 kg)  SpO2: 95%  BMI (  Calculated): 31.02    Physical Exam Vitals and nursing note reviewed.  Constitutional:      Appearance: Normal appearance.  HENT:     Head: Normocephalic and atraumatic.     Nose: Nose normal.     Mouth/Throat:     Mouth: Mucous membranes are moist.     Pharynx: Oropharynx is clear.  Eyes:     Conjunctiva/sclera: Conjunctivae normal.     Pupils: Pupils are equal, round, and reactive to light.  Cardiovascular:     Rate and Rhythm: Normal rate and regular rhythm.     Pulses: Normal pulses.     Heart sounds: Normal heart sounds. No murmur heard. Pulmonary:     Effort: Pulmonary effort is normal.     Breath sounds: Normal breath sounds. No wheezing.  Abdominal:     General: Bowel sounds are normal.     Palpations: Abdomen is soft.     Tenderness: There is no abdominal tenderness. There is no right CVA  tenderness or left CVA tenderness.  Musculoskeletal:        General: Normal range of motion.     Cervical back: Normal range of motion.     Right lower leg: No edema.     Left lower leg: No edema.  Skin:    General: Skin is warm and dry.  Neurological:     General: No focal deficit present.     Mental Status: She is alert and oriented to person, place, and time.  Psychiatric:        Mood and Affect: Mood normal.        Behavior: Behavior normal.      No results found for any visits on 09/07/23.  No results found for this or any previous visit (from the past 2160 hour(s)).    Assessment & Plan:  Patient advised to continue all her medications.  Check blood work today. 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   Total time spent: 30 minutes  Margaretann Loveless, MD  09/07/2023   This document may have been prepared by [facility] Voice Recognition software and as such may include unintentional dictation errors.

## 2023-09-08 LAB — CMP14+EGFR
ALT: 22 [IU]/L (ref 0–32)
AST: 25 [IU]/L (ref 0–40)
Albumin: 4.6 g/dL (ref 3.8–4.8)
Alkaline Phosphatase: 88 [IU]/L (ref 44–121)
BUN/Creatinine Ratio: 20 (ref 12–28)
BUN: 18 mg/dL (ref 8–27)
Bilirubin Total: 0.4 mg/dL (ref 0.0–1.2)
CO2: 20 mmol/L (ref 20–29)
Calcium: 10.8 mg/dL — ABNORMAL HIGH (ref 8.7–10.3)
Chloride: 103 mmol/L (ref 96–106)
Creatinine, Ser: 0.92 mg/dL (ref 0.57–1.00)
Globulin, Total: 2.3 g/dL (ref 1.5–4.5)
Glucose: 93 mg/dL (ref 70–99)
Potassium: 4.9 mmol/L (ref 3.5–5.2)
Sodium: 139 mmol/L (ref 134–144)
Total Protein: 6.9 g/dL (ref 6.0–8.5)
eGFR: 66 mL/min/{1.73_m2} (ref >59)

## 2023-09-08 LAB — LIPID PANEL W/O CHOL/HDL RATIO
Cholesterol, Total: 178 mg/dL (ref 100–199)
HDL: 55 mg/dL (ref >39)
LDL Chol Calc (NIH): 101 mg/dL — ABNORMAL HIGH (ref 0–99)
Triglycerides: 123 mg/dL (ref 0–149)
VLDL Cholesterol Cal: 22 mg/dL (ref 5–40)

## 2023-09-08 LAB — HEMOGLOBIN A1C
Est. average glucose Bld gHb Est-mCnc: 117 mg/dL
Hgb A1c MFr Bld: 5.7 % — ABNORMAL HIGH (ref 4.8–5.6)

## 2023-09-09 NOTE — Progress Notes (Signed)
Patient notified

## 2023-10-19 ENCOUNTER — Encounter: Payer: Self-pay | Admitting: Cardiovascular Disease

## 2023-10-19 ENCOUNTER — Ambulatory Visit: Payer: PPO | Admitting: Cardiovascular Disease

## 2023-10-19 VITALS — BP 122/76 | HR 48 | Ht 65.0 in | Wt 192.0 lb

## 2023-10-19 DIAGNOSIS — R0789 Other chest pain: Secondary | ICD-10-CM

## 2023-10-19 DIAGNOSIS — R001 Bradycardia, unspecified: Secondary | ICD-10-CM

## 2023-10-19 DIAGNOSIS — R7303 Prediabetes: Secondary | ICD-10-CM

## 2023-10-19 DIAGNOSIS — I10 Essential (primary) hypertension: Secondary | ICD-10-CM | POA: Diagnosis not present

## 2023-10-19 DIAGNOSIS — E782 Mixed hyperlipidemia: Secondary | ICD-10-CM | POA: Diagnosis not present

## 2023-10-19 NOTE — Progress Notes (Signed)
Cardiology Office Note   Date:  10/19/2023   ID:  Cassandra Miller, DOB 1951-11-02, MRN [id number]  PCP:  Margaretann Loveless, MD  Cardiologist:  Adrian Blackwater, MD      History of Present Illness: Cassandra Miller is a 72 y.o. female who presents for  Chief Complaint  Patient presents with   Follow-up    6 month follow up    No dizziness      Past Medical History:  Diagnosis Date   Hyperlipemia    Hypertension    Skin cancer      Past Surgical History:  Procedure Laterality Date   CESAREAN SECTION     COLONOSCOPY WITH PROPOFOL N/A 11/17/2019   Procedure: COLONOSCOPY WITH PROPOFOL;  Surgeon: Midge Minium, MD;  Location: [facility] ENDOSCOPY;  Service: Endoscopy;  Laterality: N/A;   LYMPH NODE DISSECTION     SKIN CANCER EXCISION       Current Outpatient Medications  Medication Sig Dispense Refill   furosemide (LASIX) 20 MG tablet Take 1 tab three times a week 40 tablet 3   hydrALAZINE (APRESOLINE) 50 MG tablet Take 1 tablet (50 mg total) by mouth 2 (two) times daily. 180 tablet 3   Multiple Vitamin (MULTIVITAMIN) capsule Take 1 capsule by mouth daily.     olmesartan (BENICAR) 40 MG tablet Take 1 tablet (40 mg total) by mouth daily. 90 tablet 3   simvastatin (ZOCOR) 20 MG tablet Take 1 tablet (20 mg total) by mouth daily. 90 tablet 3   spironolactone (ALDACTONE) 25 MG tablet Take 1 tablet (25 mg total) by mouth daily. 90 tablet 3   No current facility-administered medications for this visit.    Allergies:   Iodinated contrast media    Social History:   reports that she has never smoked. She has never used smokeless tobacco. She reports current alcohol use. She reports that she does not use drugs.   Family History:  family history includes Breast cancer in her paternal aunt and paternal grandmother.    ROS:     Review of Systems  Constitutional: Negative.   HENT: Negative.    Eyes: Negative.   Respiratory: Negative.    Gastrointestinal: Negative.   Genitourinary:  Negative.   Musculoskeletal: Negative.   Skin: Negative.   Neurological: Negative.   Endo/Heme/Allergies: Negative.   Psychiatric/Behavioral: Negative.    All other systems reviewed and are negative.     All other systems are reviewed and negative.    PHYSICAL EXAM: VS:  BP 122/76   Pulse (!) 48   Ht 5\' 5"  (1.651 m)   Wt 192 lb (87.1 kg)   SpO2 97%   BMI 31.95 kg/m  , BMI Body mass index is 31.95 kg/m. Last weight:  Wt Readings from Last 3 Encounters:  10/19/23 192 lb (87.1 kg)  09/07/23 186 lb 6.4 oz (84.6 kg)  05/06/23 194 lb (88 kg)     Physical Exam Constitutional:      Appearance: Normal appearance.  Cardiovascular:     Rate and Rhythm: Normal rate and regular rhythm.     Heart sounds: Normal heart sounds.  Pulmonary:     Effort: Pulmonary effort is normal.     Breath sounds: Normal breath sounds.  Musculoskeletal:     Right lower leg: No edema.     Left lower leg: No edema.  Neurological:     Mental Status: She is alert.       EKG:   Recent  Labs: 09/07/2023: ALT 22; BUN 18; Creatinine, Ser 0.92; Potassium 4.9; Sodium 139    Lipid Panel    Component Value Date/Time   CHOL 178 09/07/2023 0931   TRIG 123 09/07/2023 0931   HDL 55 09/07/2023 0931   CHOLHDL 2.7 04/28/2018 0809   LDLCALC 101 (H) 09/07/2023 0931      Other studies Reviewed: Additional studies/ records that were reviewed today include:  Review of the above records demonstrates:       No data to display            ASSESSMENT AND PLAN:    ICD-10-CM   1. Essential hypertension, benign  I10    stable    2. Prediabetes  R73.03     3. Mixed hyperlipidemia  E78.2     4. Other chest pain  R07.89    chest pain resolved    5. Sinus bradycardia  R00.1    asymptomatic       Problem List Items Addressed This Visit       Cardiovascular and Mediastinum   Essential hypertension, benign - Primary     Other   Other chest pain   Prediabetes   Mixed hyperlipidemia    Other Visit Diagnoses     Sinus bradycardia       asymptomatic          Disposition:   Return in about 3 months (around 01/19/2024).    Total time spent: 30 minutes  Signed,  Adrian Blackwater, MD  10/19/2023 10:17 AM    [facility]

## 2023-10-21 DIAGNOSIS — D2262 Melanocytic nevi of left upper limb, including shoulder: Secondary | ICD-10-CM | POA: Diagnosis not present

## 2023-10-21 DIAGNOSIS — L821 Other seborrheic keratosis: Secondary | ICD-10-CM | POA: Diagnosis not present

## 2023-10-21 DIAGNOSIS — D2261 Melanocytic nevi of right upper limb, including shoulder: Secondary | ICD-10-CM | POA: Diagnosis not present

## 2023-10-21 DIAGNOSIS — Z08 Encounter for follow-up examination after completed treatment for malignant neoplasm: Secondary | ICD-10-CM | POA: Diagnosis not present

## 2023-10-21 DIAGNOSIS — D2271 Melanocytic nevi of right lower limb, including hip: Secondary | ICD-10-CM | POA: Diagnosis not present

## 2023-10-21 DIAGNOSIS — D2272 Melanocytic nevi of left lower limb, including hip: Secondary | ICD-10-CM | POA: Diagnosis not present

## 2023-10-21 DIAGNOSIS — D225 Melanocytic nevi of trunk: Secondary | ICD-10-CM | POA: Diagnosis not present

## 2023-10-21 DIAGNOSIS — Z85820 Personal history of malignant melanoma of skin: Secondary | ICD-10-CM | POA: Diagnosis not present

## 2023-11-10 ENCOUNTER — Other Ambulatory Visit: Payer: Self-pay | Admitting: Internal Medicine

## 2023-11-10 DIAGNOSIS — E782 Mixed hyperlipidemia: Secondary | ICD-10-CM

## 2023-11-10 MED ORDER — SIMVASTATIN 20 MG PO TABS: 20.0000 mg | ORAL_TABLET | Freq: Every day | ORAL | 3 refills | Status: DC

## 2023-11-12 ENCOUNTER — Other Ambulatory Visit: Payer: Self-pay | Admitting: Internal Medicine

## 2023-11-12 DIAGNOSIS — Z1231 Encounter for screening mammogram for malignant neoplasm of breast: Secondary | ICD-10-CM

## 2023-12-28 ENCOUNTER — Ambulatory Visit
Admission: RE | Admit: 2023-12-28 | Discharge: 2023-12-28 | Disposition: A | Discharge status: Home / Self Care | Payer: PPO | Source: Ambulatory Visit | Attending: Internal Medicine | Admitting: Internal Medicine

## 2023-12-28 DIAGNOSIS — Z1231 Encounter for screening mammogram for malignant neoplasm of breast: Secondary | ICD-10-CM | POA: Diagnosis not present

## 2024-01-11 ENCOUNTER — Ambulatory Visit: Payer: PPO | Admitting: Internal Medicine

## 2024-03-21 ENCOUNTER — Ambulatory Visit: Payer: PPO | Admitting: Cardiovascular Disease

## 2024-04-25 DIAGNOSIS — Z08 Encounter for follow-up examination after completed treatment for malignant neoplasm: Secondary | ICD-10-CM | POA: Diagnosis not present

## 2024-04-25 DIAGNOSIS — Z85820 Personal history of malignant melanoma of skin: Secondary | ICD-10-CM | POA: Diagnosis not present

## 2024-04-25 DIAGNOSIS — D2272 Melanocytic nevi of left lower limb, including hip: Secondary | ICD-10-CM | POA: Diagnosis not present

## 2024-04-25 DIAGNOSIS — D225 Melanocytic nevi of trunk: Secondary | ICD-10-CM | POA: Diagnosis not present

## 2024-04-25 DIAGNOSIS — D2262 Melanocytic nevi of left upper limb, including shoulder: Secondary | ICD-10-CM | POA: Diagnosis not present

## 2024-04-25 DIAGNOSIS — L821 Other seborrheic keratosis: Secondary | ICD-10-CM | POA: Diagnosis not present

## 2024-04-25 DIAGNOSIS — L570 Actinic keratosis: Secondary | ICD-10-CM | POA: Diagnosis not present

## 2024-04-25 DIAGNOSIS — D2261 Melanocytic nevi of right upper limb, including shoulder: Secondary | ICD-10-CM | POA: Diagnosis not present

## 2024-04-25 DIAGNOSIS — D2271 Melanocytic nevi of right lower limb, including hip: Secondary | ICD-10-CM | POA: Diagnosis not present

## 2024-07-25 NOTE — Progress Notes (Signed)
   07/25/2024  Patient ID: Cassandra Miller, female   DOB: [birth year], 73 y.o.   MRN: [id number]  Pharmacy Quality Measure Review  This patient is appearing on a report for being at risk of failing the adherence measure for cholesterol (statin) medications this calendar year.   Medication: Simvastatin  20mg  Last fill date: 05/14/24 for 90 day supply  Insurance report was not up to date. No action needed at this time.   Jon VEAR Lindau, PharmD Clinical Pharmacist [phone]

## 2024-08-01 NOTE — Progress Notes (Unsigned)
   08/01/2024  Patient ID: Cassandra Miller, female   DOB: 09/05/1951, 73 y.o.   MRN: [id number]  Pharmacy Quality Measure Review  This patient is appearing on a report for being at risk of failing the adherence measure for hypertension (ACEi/ARB) medications this calendar year.   Medication: Olmesartan  40mg  Last fill date: 05/22/24 for 90 day supply  Insurance report was not up to date. No action needed at this time.   Jon VEAR Lindau, PharmD Clinical Pharmacist [phone]

## 2024-08-16 ENCOUNTER — Telehealth: Payer: Self-pay

## 2024-08-16 NOTE — Progress Notes (Addendum)
   08/16/2024  Patient ID: Cassandra Miller, female   DOB: 11/05/1951, 73 y.o.   MRN: [id number]  Pharmacy Quality Measure Review  This patient is appearing on a report for being at risk of failing the adherence measure for cholesterol (statin) medications this calendar year.   Medication: Simvastatin  20mg  Last fill date: 05/14/24 for 90 day supply  Spoke with patient. She is placing an order for refill this week. Reminded patient to schedule follow up appt with PCP.  Jon VEAR Lindau, PharmD Clinical Pharmacist [phone]

## 2024-08-25 ENCOUNTER — Ambulatory Visit: Admitting: Internal Medicine

## 2024-08-29 ENCOUNTER — Ambulatory Visit: Admitting: Cardiovascular Disease

## 2024-08-31 ENCOUNTER — Ambulatory Visit (INDEPENDENT_AMBULATORY_CARE_PROVIDER_SITE_OTHER): Admitting: Internal Medicine

## 2024-08-31 ENCOUNTER — Encounter: Payer: Self-pay | Admitting: Internal Medicine

## 2024-08-31 DIAGNOSIS — I10 Essential (primary) hypertension: Secondary | ICD-10-CM

## 2024-08-31 DIAGNOSIS — R7303 Prediabetes: Secondary | ICD-10-CM | POA: Diagnosis not present

## 2024-08-31 DIAGNOSIS — C4359 Malignant melanoma of other part of trunk: Secondary | ICD-10-CM

## 2024-08-31 DIAGNOSIS — E782 Mixed hyperlipidemia: Secondary | ICD-10-CM

## 2024-08-31 MED ORDER — SIMVASTATIN 20 MG PO TABS: 20.0000 mg | ORAL_TABLET | Freq: Every day | ORAL | 3 refills | Status: AC

## 2024-08-31 MED ORDER — OLMESARTAN MEDOXOMIL 40 MG PO TABS: 40.0000 mg | ORAL_TABLET | Freq: Every day | ORAL | 3 refills | Status: AC

## 2024-08-31 MED ORDER — SPIRONOLACTONE 25 MG PO TABS: 25.0000 mg | ORAL_TABLET | Freq: Every day | ORAL | 3 refills | Status: DC

## 2024-08-31 MED ORDER — FUROSEMIDE 20 MG PO TABS: ORAL_TABLET | ORAL | 3 refills | Status: DC

## 2024-08-31 MED ORDER — FUROSEMIDE 20 MG PO TABS: ORAL_TABLET | ORAL | 3 refills | Status: AC

## 2024-08-31 MED ORDER — OLMESARTAN MEDOXOMIL 40 MG PO TABS: 40.0000 mg | ORAL_TABLET | Freq: Every day | ORAL | 3 refills | Status: DC

## 2024-08-31 MED ORDER — SIMVASTATIN 20 MG PO TABS: 20.0000 mg | ORAL_TABLET | Freq: Every day | ORAL | 3 refills | Status: DC

## 2024-08-31 MED ORDER — HYDRALAZINE HCL 50 MG PO TABS: 50.0000 mg | ORAL_TABLET | Freq: Two times a day (BID) | ORAL | 3 refills | Status: DC

## 2024-08-31 MED ORDER — SPIRONOLACTONE 25 MG PO TABS: 25.0000 mg | ORAL_TABLET | Freq: Every day | ORAL | 3 refills | Status: AC

## 2024-08-31 MED ORDER — HYDRALAZINE HCL 50 MG PO TABS: 50.0000 mg | ORAL_TABLET | Freq: Two times a day (BID) | ORAL | 3 refills | Status: AC

## 2024-08-31 NOTE — Progress Notes (Signed)
 Established Patient Office Visit  Subjective:  Patient ID: Cassandra Miller, female    DOB: 1951-07-10  Age: 73 y.o. MRN: [id number]  Chief Complaint  Patient presents with   Follow-up    4 month follow up    Patient comes in for her follow-up today.  She has not been in since October of last year.  Patient reports that her husband has not been doing well and she was busy with taking care of his health.  In general she feels well, no complaints of chest pain no, no shortness of breath and no palpitations. Needs medication refills. Mammogram is up-to-date. Will return fasting for labs.    No other concerns at this time.   Past Medical History:  Diagnosis Date   Hyperlipemia    Hypertension    Skin cancer     Past Surgical History:  Procedure Laterality Date   CESAREAN SECTION     COLONOSCOPY WITH PROPOFOL  N/A 11/17/2019   Procedure: COLONOSCOPY WITH PROPOFOL ;  Surgeon: Jinny Carmine, MD;  Location: [facility] ENDOSCOPY;  Service: Endoscopy;  Laterality: N/A;   LYMPH NODE DISSECTION     SKIN CANCER EXCISION      Social History   Socioeconomic History   Marital status: Married    Spouse name: Not on file   Number of children: Not on file   Years of education: Not on file   Highest education level: Not on file  Occupational History   Not on file  Tobacco Use   Smoking status: Never   Smokeless tobacco: Never  Vaping Use   Vaping status: Never Used  Substance and Sexual Activity   Alcohol use: Yes    Comment: social   Drug use: No   Sexual activity: Not Currently    Birth control/protection: Post-menopausal  Other Topics Concern   Not on file  Social History Narrative   Not on file   Social Drivers of Health   Financial Resource Strain: Not on file  Food Insecurity: Not on file  Transportation Needs: Not on file  Physical Activity: Not on file  Stress: Not on file  Social Connections: Not on file  Intimate Partner Violence: Not on file    Family History   Problem Relation Age of Onset   Breast cancer Paternal Aunt        41's   Breast cancer Paternal Grandmother        16's    Allergies  Allergen Reactions   Iodinated Contrast Media     Rash    Outpatient Medications Prior to Visit  Medication Sig   Multiple Vitamin (MULTIVITAMIN) capsule Take 1 capsule by mouth daily.   [DISCONTINUED] furosemide  (LASIX ) 20 MG tablet Take 1 tab three times a week   [DISCONTINUED] hydrALAZINE  (APRESOLINE ) 50 MG tablet Take 1 tablet (50 mg total) by mouth 2 (two) times daily.   [DISCONTINUED] olmesartan  (BENICAR ) 40 MG tablet Take 1 tablet (40 mg total) by mouth daily.   [DISCONTINUED] simvastatin  (ZOCOR ) 20 MG tablet Take 1 tablet (20 mg total) by mouth daily.   [DISCONTINUED] spironolactone  (ALDACTONE ) 25 MG tablet Take 1 tablet (25 mg total) by mouth daily.   No facility-administered medications prior to visit.    Review of Systems  Constitutional: Negative.  Negative for chills, fever and malaise/fatigue.  HENT: Negative.  Negative for congestion and sore throat.   Eyes: Negative.  Negative for blurred vision and pain.  Respiratory: Negative.  Negative for cough and shortness of breath.  Cardiovascular: Negative.  Negative for chest pain, palpitations and leg swelling.  Gastrointestinal: Negative.  Negative for abdominal pain, blood in stool, constipation, diarrhea, heartburn, melena, nausea and vomiting.  Genitourinary: Negative.  Negative for dysuria, flank pain, frequency and urgency.  Musculoskeletal: Negative.  Negative for joint pain and myalgias.  Skin: Negative.   Neurological: Negative.  Negative for dizziness, tingling, sensory change, weakness and headaches.  Endo/Heme/Allergies: Negative.   Psychiatric/Behavioral: Negative.  Negative for depression and suicidal ideas. The patient is not nervous/anxious.        Objective:   BP (!) 140/78   Pulse 62   Ht 5' 5 (1.651 m)   Wt 199 lb 6.4 oz (90.4 kg)   SpO2 96%   BMI 33.18  kg/m   Vitals:   08/31/24 1423  BP: (!) 140/78  Pulse: 62  Height: 5' 5 (1.651 m)  Weight: 199 lb 6.4 oz (90.4 kg)  SpO2: 96%  BMI (Calculated): 33.18    Physical Exam Vitals and nursing note reviewed.  Constitutional:      Appearance: Normal appearance.  HENT:     Head: Normocephalic and atraumatic.     Nose: Nose normal.     Mouth/Throat:     Mouth: Mucous membranes are moist.     Pharynx: Oropharynx is clear.  Eyes:     Conjunctiva/sclera: Conjunctivae normal.     Pupils: Pupils are equal, round, and reactive to light.  Cardiovascular:     Rate and Rhythm: Normal rate and regular rhythm.     Pulses: Normal pulses.     Heart sounds: Normal heart sounds. No murmur heard. Pulmonary:     Effort: Pulmonary effort is normal.     Breath sounds: Normal breath sounds. No wheezing.  Abdominal:     General: Bowel sounds are normal.     Palpations: Abdomen is soft.     Tenderness: There is no abdominal tenderness. There is no right CVA tenderness or left CVA tenderness.  Musculoskeletal:        General: Normal range of motion.     Cervical back: Normal range of motion.     Right lower leg: No edema.     Left lower leg: No edema.  Skin:    General: Skin is warm and dry.  Neurological:     General: No focal deficit present.     Mental Status: She is alert and oriented to person, place, and time.  Psychiatric:        Mood and Affect: Mood normal.        Behavior: Behavior normal.      No results found for any visits on 08/31/24.  No results found for this or any previous visit (from the past 2160 hours).    Assessment & Plan:  Patient to continue all her medications.  Monitor blood pressure at home.  Strict diet control and weight reduction emphasized.  Patient will return fasting for labs. Problem List Items Addressed This Visit     Essential hypertension, benign   Relevant Medications   spironolactone  (ALDACTONE ) 25 MG tablet   simvastatin  (ZOCOR ) 20 MG tablet    olmesartan  (BENICAR ) 40 MG tablet   hydrALAZINE  (APRESOLINE ) 50 MG tablet   furosemide  (LASIX ) 20 MG tablet   Other Relevant Orders   CMP14+EGFR   CBC with Diff   Malignant melanoma of torso excluding breast (HCC)   Prediabetes   Relevant Orders   Hemoglobin A1c   Mixed hyperlipidemia   Relevant Medications  spironolactone  (ALDACTONE ) 25 MG tablet   simvastatin  (ZOCOR ) 20 MG tablet   olmesartan  (BENICAR ) 40 MG tablet   hydrALAZINE  (APRESOLINE ) 50 MG tablet   furosemide  (LASIX ) 20 MG tablet   Other Relevant Orders   Lipid Panel w/o Chol/HDL Ratio    Return in about 3 months (around 11/30/2024).   Total time spent: 30 minutes  FERNAND FREDY RAMAN, MD  08/31/2024   This document may have been prepared by [facility] Voice Recognition software and as such may include unintentional dictation errors.

## 2024-09-01 ENCOUNTER — Encounter: Payer: Self-pay | Admitting: Cardiovascular Disease

## 2024-09-01 ENCOUNTER — Ambulatory Visit: Admitting: Cardiovascular Disease

## 2024-09-01 VITALS — BP 128/62 | HR 54 | Ht 65.0 in | Wt 197.8 lb

## 2024-09-01 DIAGNOSIS — E7800 Pure hypercholesterolemia, unspecified: Secondary | ICD-10-CM | POA: Diagnosis not present

## 2024-09-01 DIAGNOSIS — I10 Essential (primary) hypertension: Secondary | ICD-10-CM | POA: Diagnosis not present

## 2024-09-01 DIAGNOSIS — R001 Bradycardia, unspecified: Secondary | ICD-10-CM | POA: Diagnosis not present

## 2024-09-01 DIAGNOSIS — I5033 Acute on chronic diastolic (congestive) heart failure: Secondary | ICD-10-CM

## 2024-09-01 DIAGNOSIS — E782 Mixed hyperlipidemia: Secondary | ICD-10-CM | POA: Diagnosis not present

## 2024-09-01 DIAGNOSIS — R7303 Prediabetes: Secondary | ICD-10-CM | POA: Diagnosis not present

## 2024-09-01 DIAGNOSIS — R0789 Other chest pain: Secondary | ICD-10-CM | POA: Diagnosis not present

## 2024-09-01 NOTE — Progress Notes (Signed)
 Cardiology Office Note   Date:  09/01/2024   ID:  Cassandra Miller, DOB 1950/12/12, MRN [id number]  PCP:  Cassandra Fredy RAMAN, MD  Cardiologist:  Cassandra Fernand, MD      History of Present Illness: Cassandra Miller is a 73 y.o. female who presents for  Chief Complaint  Patient presents with   Follow-up    4 month follow up    No complaints.      Past Medical History:  Diagnosis Date   Hyperlipemia    Hypertension    Skin cancer      Past Surgical History:  Procedure Laterality Date   CESAREAN SECTION     COLONOSCOPY WITH PROPOFOL  N/A 11/17/2019   Procedure: COLONOSCOPY WITH PROPOFOL ;  Surgeon: Cassandra Carmine, MD;  Location: [facility] ENDOSCOPY;  Service: Endoscopy;  Laterality: N/A;   LYMPH NODE DISSECTION     SKIN CANCER EXCISION       Current Outpatient Medications  Medication Sig Dispense Refill   furosemide  (LASIX ) 20 MG tablet Take 1 tab three times a week 40 tablet 3   hydrALAZINE  (APRESOLINE ) 50 MG tablet Take 1 tablet (50 mg total) by mouth 2 (two) times daily. 180 tablet 3   Multiple Vitamin (MULTIVITAMIN) capsule Take 1 capsule by mouth daily.     olmesartan  (BENICAR ) 40 MG tablet Take 1 tablet (40 mg total) by mouth daily. 90 tablet 3   simvastatin  (ZOCOR ) 20 MG tablet Take 1 tablet (20 mg total) by mouth daily. 90 tablet 3   spironolactone  (ALDACTONE ) 25 MG tablet Take 1 tablet (25 mg total) by mouth daily. 90 tablet 3   No current facility-administered medications for this visit.    Allergies:   Iodinated contrast media    Social History:   reports that she has never smoked. She has never used smokeless tobacco. She reports current alcohol use. She reports that she does not use drugs.   Family History:  family history includes Breast cancer in her paternal aunt and paternal grandmother.    ROS:     Review of Systems  Constitutional: Negative.   HENT: Negative.    Eyes: Negative.   Respiratory: Negative.    Gastrointestinal: Negative.   Genitourinary:  Negative.   Musculoskeletal: Negative.   Skin: Negative.   Neurological: Negative.   Endo/Heme/Allergies: Negative.   Psychiatric/Behavioral: Negative.    All other systems reviewed and are negative.     All other systems are reviewed and negative.    PHYSICAL EXAM: VS:  BP 128/62   Pulse (!) 54   Ht 5' 5 (1.651 m)   Wt 197 lb 12.8 oz (89.7 kg)   SpO2 97%   BMI 32.92 kg/m  , BMI Body mass index is 32.92 kg/m. Last weight:  Wt Readings from Last 3 Encounters:  09/01/24 197 lb 12.8 oz (89.7 kg)  08/31/24 199 lb 6.4 oz (90.4 kg)  10/19/23 192 lb (87.1 kg)     Physical Exam Constitutional:      Appearance: Normal appearance.  Cardiovascular:     Rate and Rhythm: Normal rate and regular rhythm.     Heart sounds: Normal heart sounds.  Pulmonary:     Effort: Pulmonary effort is normal.     Breath sounds: Normal breath sounds.  Musculoskeletal:     Right lower leg: No edema.     Left lower leg: No edema.  Neurological:     Mental Status: She is alert.  EKG:   Recent Labs: 09/07/2023: ALT 22; BUN 18; Creatinine, Ser 0.92; Potassium 4.9; Sodium 139    Lipid Panel    Component Value Date/Time   CHOL 178 09/07/2023 0931   TRIG 123 09/07/2023 0931   HDL 55 09/07/2023 0931   CHOLHDL 2.7 04/28/2018 0809   LDLCALC 101 (H) 09/07/2023 0931      Other studies Reviewed: Additional studies/ records that were reviewed today include:  Review of the above records demonstrates:       No data to display            ASSESSMENT AND PLAN:    ICD-10-CM   1. Essential hypertension, benign  I10     2. Hypercholesteremia  E78.00     3. Other chest pain  R07.89     4. Sinus bradycardia  R00.1     5. CHF (congestive heart failure), NYHA class III, acute on chronic, diastolic (HCC)  I50.33    on lasix  and aldactone .       Problem List Items Addressed This Visit       Cardiovascular and Mediastinum   Essential hypertension, benign - Primary     Other    Hypercholesteremia   Other chest pain   Other Visit Diagnoses       Sinus bradycardia         CHF (congestive heart failure), NYHA class III, acute on chronic, diastolic (HCC)       on lasix  and aldactone .          Disposition:   Return in about 3 months (around 12/01/2024).    Total time spent: 30 minutes  Signed,  Cassandra Bathe, MD  09/01/2024 11:21 AM    [facility]

## 2024-09-01 NOTE — Progress Notes (Signed)
   09/01/2024  Patient ID: Cassandra Miller, female   DOB: 04/30/1951, 73 y.o.   MRN: [id number]  Pharmacy Quality Measure Review  This patient is appearing on a report for being at risk of failing the adherence measure for cholesterol (statin) and hypertension (ACEi/ARB) medications this calendar year.   Medication: Olmesartan  Last fill date: 08/28/24 for 90 day supply  Medication: Simvastatin  Last fill date: 08/23/24 for 90 day supply  Insurance report was not up to date. No action needed at this time.   Jon VEAR Lindau, PharmD Clinical Pharmacist [phone]

## 2024-09-02 LAB — HEMOGLOBIN A1C
Est. average glucose Bld gHb Est-mCnc: 114 mg/dL
Hgb A1c MFr Bld: 5.6 % (ref 4.8–5.6)

## 2024-09-02 LAB — CMP14+EGFR
ALT: 34 IU/L — ABNORMAL HIGH (ref 0–32)
AST: 36 IU/L (ref 0–40)
Albumin: 4.4 g/dL (ref 3.8–4.8)
Alkaline Phosphatase: 88 IU/L (ref 49–135)
BUN/Creatinine Ratio: 16 (ref 12–28)
BUN: 14 mg/dL (ref 8–27)
Bilirubin Total: 0.5 mg/dL (ref 0.0–1.2)
CO2: 23 mmol/L (ref 20–29)
Calcium: 10.7 mg/dL — ABNORMAL HIGH (ref 8.7–10.3)
Chloride: 104 mmol/L (ref 96–106)
Creatinine, Ser: 0.9 mg/dL (ref 0.57–1.00)
Globulin, Total: 2.5 g/dL (ref 1.5–4.5)
Glucose: 86 mg/dL (ref 70–99)
Potassium: 4.6 mmol/L (ref 3.5–5.2)
Sodium: 141 mmol/L (ref 134–144)
Total Protein: 6.9 g/dL (ref 6.0–8.5)
eGFR: 67 mL/min/1.73 (ref >59)

## 2024-09-02 LAB — CBC WITH DIFFERENTIAL/PLATELET
Basophils Absolute: 0.1 x10E3/uL (ref 0.0–0.2)
Basos: 1 %
EOS (ABSOLUTE): 0.1 x10E3/uL (ref 0.0–0.4)
Eos: 1 %
Hematocrit: 45.5 % (ref 34.0–46.6)
Hemoglobin: 14.7 g/dL (ref 11.1–15.9)
Immature Grans (Abs): 0.1 x10E3/uL (ref 0.0–0.1)
Immature Granulocytes: 1 %
Lymphocytes Absolute: 2.3 x10E3/uL (ref 0.7–3.1)
Lymphs: 26 %
MCH: 32.3 pg (ref 26.6–33.0)
MCHC: 32.3 g/dL (ref 31.5–35.7)
MCV: 100 fL — ABNORMAL HIGH (ref 79–97)
Monocytes Absolute: 1 x10E3/uL — ABNORMAL HIGH (ref 0.1–0.9)
Monocytes: 12 %
Neutrophils Absolute: 5.1 x10E3/uL (ref 1.4–7.0)
Neutrophils: 59 %
Platelets: 347 x10E3/uL (ref 150–450)
RBC: 4.55 x10E6/uL (ref 3.77–5.28)
RDW: 12.4 % (ref 11.7–15.4)
WBC: 8.6 x10E3/uL (ref 3.4–10.8)

## 2024-09-02 LAB — LIPID PANEL W/O CHOL/HDL RATIO
Cholesterol, Total: 185 mg/dL (ref 100–199)
HDL: 58 mg/dL (ref >39)
LDL Chol Calc (NIH): 103 mg/dL — ABNORMAL HIGH (ref 0–99)
Triglycerides: 137 mg/dL (ref 0–149)
VLDL Cholesterol Cal: 24 mg/dL (ref 5–40)

## 2024-09-04 ENCOUNTER — Ambulatory Visit: Payer: Self-pay | Admitting: Internal Medicine

## 2024-11-16 DIAGNOSIS — Z85820 Personal history of malignant melanoma of skin: Secondary | ICD-10-CM | POA: Diagnosis not present

## 2024-11-16 DIAGNOSIS — D225 Melanocytic nevi of trunk: Secondary | ICD-10-CM | POA: Diagnosis not present

## 2024-11-16 DIAGNOSIS — D2262 Melanocytic nevi of left upper limb, including shoulder: Secondary | ICD-10-CM | POA: Diagnosis not present

## 2024-11-16 DIAGNOSIS — D2272 Melanocytic nevi of left lower limb, including hip: Secondary | ICD-10-CM | POA: Diagnosis not present

## 2024-11-16 DIAGNOSIS — D2261 Melanocytic nevi of right upper limb, including shoulder: Secondary | ICD-10-CM | POA: Diagnosis not present

## 2024-11-16 DIAGNOSIS — D2271 Melanocytic nevi of right lower limb, including hip: Secondary | ICD-10-CM | POA: Diagnosis not present

## 2024-11-16 DIAGNOSIS — Z08 Encounter for follow-up examination after completed treatment for malignant neoplasm: Secondary | ICD-10-CM | POA: Diagnosis not present

## 2024-11-16 DIAGNOSIS — L821 Other seborrheic keratosis: Secondary | ICD-10-CM | POA: Diagnosis not present

## 2024-11-22 ENCOUNTER — Other Ambulatory Visit: Payer: Self-pay | Admitting: Internal Medicine

## 2024-11-22 DIAGNOSIS — Z1231 Encounter for screening mammogram for malignant neoplasm of breast: Secondary | ICD-10-CM

## 2024-11-24 ENCOUNTER — Encounter: Payer: Self-pay | Admitting: Cardiovascular Disease

## 2024-11-24 ENCOUNTER — Ambulatory Visit: Admitting: Cardiovascular Disease

## 2024-11-24 VITALS — BP 124/75 | HR 65 | Ht 65.0 in | Wt 198.4 lb

## 2024-11-24 DIAGNOSIS — E7800 Pure hypercholesterolemia, unspecified: Secondary | ICD-10-CM | POA: Diagnosis not present

## 2024-11-24 DIAGNOSIS — E782 Mixed hyperlipidemia: Secondary | ICD-10-CM

## 2024-11-24 DIAGNOSIS — R7303 Prediabetes: Secondary | ICD-10-CM

## 2024-11-24 DIAGNOSIS — I10 Essential (primary) hypertension: Secondary | ICD-10-CM | POA: Diagnosis not present

## 2024-11-24 DIAGNOSIS — I5033 Acute on chronic diastolic (congestive) heart failure: Secondary | ICD-10-CM | POA: Diagnosis not present

## 2024-11-24 DIAGNOSIS — R001 Bradycardia, unspecified: Secondary | ICD-10-CM | POA: Diagnosis not present

## 2024-11-24 DIAGNOSIS — R0789 Other chest pain: Secondary | ICD-10-CM

## 2024-11-24 NOTE — Progress Notes (Signed)
 "     Cardiology Office Note   Date:  11/24/2024   ID:  Cassandra Miller, DOB 09/03/1951, MRN [id number]  PCP:  Fernand Fredy RAMAN, MD  Cardiologist:  Denyse Fernand, MD      History of Present Illness: Cassandra Miller is a 73 y.o. female who presents for  Chief Complaint  Patient presents with   Follow-up    3 month follow up     No complaints, no chest pain or SOB.      Past Medical History:  Diagnosis Date   Hyperlipemia    Hypertension    Skin cancer      Past Surgical History:  Procedure Laterality Date   CESAREAN SECTION     COLONOSCOPY WITH PROPOFOL  N/A 11/17/2019   Procedure: COLONOSCOPY WITH PROPOFOL ;  Surgeon: Jinny Carmine, MD;  Location: [facility] ENDOSCOPY;  Service: Endoscopy;  Laterality: N/A;   LYMPH NODE DISSECTION     SKIN CANCER EXCISION       Current Outpatient Medications  Medication Sig Dispense Refill   furosemide  (LASIX ) 20 MG tablet Take 1 tab three times a week 40 tablet 3   hydrALAZINE  (APRESOLINE ) 50 MG tablet Take 1 tablet (50 mg total) by mouth 2 (two) times daily. 180 tablet 3   Multiple Vitamin (MULTIVITAMIN) capsule Take 1 capsule by mouth daily.     olmesartan  (BENICAR ) 40 MG tablet Take 1 tablet (40 mg total) by mouth daily. 90 tablet 3   simvastatin  (ZOCOR ) 20 MG tablet Take 1 tablet (20 mg total) by mouth daily. 90 tablet 3   spironolactone  (ALDACTONE ) 25 MG tablet Take 1 tablet (25 mg total) by mouth daily. 90 tablet 3   No current facility-administered medications for this visit.    Allergies:   Iodinated contrast media    Social History:   reports that she has never smoked. She has never used smokeless tobacco. She reports current alcohol use. She reports that she does not use drugs.   Family History:  family history includes Breast cancer in her paternal aunt and paternal grandmother.    ROS:     Review of Systems  Constitutional: Negative.   HENT: Negative.    Eyes: Negative.   Respiratory: Negative.    Gastrointestinal:  Negative.   Genitourinary: Negative.   Musculoskeletal: Negative.   Skin: Negative.   Neurological: Negative.   Endo/Heme/Allergies: Negative.   Psychiatric/Behavioral: Negative.    All other systems reviewed and are negative.     All other systems are reviewed and negative.    PHYSICAL EXAM: VS:  BP 124/75   Pulse 65   Ht 5' 5 (1.651 m)   Wt 198 lb 6.4 oz (90 kg)   SpO2 97%   BMI 33.02 kg/m  , BMI Body mass index is 33.02 kg/m. Last weight:  Wt Readings from Last 3 Encounters:  11/24/24 198 lb 6.4 oz (90 kg)  09/01/24 197 lb 12.8 oz (89.7 kg)  08/31/24 199 lb 6.4 oz (90.4 kg)     Physical Exam Constitutional:      Appearance: Normal appearance.  Cardiovascular:     Rate and Rhythm: Normal rate and regular rhythm.     Heart sounds: Normal heart sounds.  Pulmonary:     Effort: Pulmonary effort is normal.     Breath sounds: Normal breath sounds.  Musculoskeletal:     Right lower leg: No edema.     Left lower leg: No edema.  Neurological:     Mental  Status: She is alert.       EKG:   Recent Labs: 09/01/2024: ALT 34; BUN 14; Creatinine, Ser 0.90; Hemoglobin 14.7; Platelets 347; Potassium 4.6; Sodium 141    Lipid Panel    Component Value Date/Time   CHOL 185 09/01/2024 1124   TRIG 137 09/01/2024 1124   HDL 58 09/01/2024 1124   CHOLHDL 2.7 04/28/2018 0809   LDLCALC 103 (H) 09/01/2024 1124      Other studies Reviewed: Additional studies/ records that were reviewed today include:  Review of the above records demonstrates:       No data to display            ASSESSMENT AND PLAN:    ICD-10-CM   1. Essential hypertension, benign  I10     2. Hypercholesteremia  E78.00     3. Other chest pain  R07.89     4. Sinus bradycardia  R00.1     5. CHF (congestive heart failure), NYHA class III, acute on chronic, diastolic (HCC)  I50.33    on lasix , aldactone  , and not SOB.    6. Prediabetes  R73.03     7. Mixed hyperlipidemia  E78.2         Problem List Items Addressed This Visit       Cardiovascular and Mediastinum   Essential hypertension, benign - Primary     Other   Hypercholesteremia   Other chest pain   Prediabetes   Mixed hyperlipidemia   Other Visit Diagnoses       Sinus bradycardia         CHF (congestive heart failure), NYHA class III, acute on chronic, diastolic (HCC)       on lasix , aldactone  , and not SOB.          Disposition:   Return in about 3 months (around 02/22/2025).    Total time spent: 30 minutes  Signed,  Denyse Bathe, MD  11/24/2024 10:45 AM    [facility] "

## 2024-12-14 ENCOUNTER — Ambulatory Visit: Admitting: Internal Medicine

## 2024-12-15 ENCOUNTER — Ambulatory Visit: Admitting: Internal Medicine

## 2024-12-15 ENCOUNTER — Encounter: Payer: Self-pay | Admitting: Internal Medicine

## 2024-12-15 ENCOUNTER — Ambulatory Visit: Payer: Self-pay | Admitting: Internal Medicine

## 2024-12-15 VITALS — BP 160/90 | HR 65 | Ht 65.0 in | Wt 201.2 lb

## 2024-12-15 DIAGNOSIS — Z1231 Encounter for screening mammogram for malignant neoplasm of breast: Secondary | ICD-10-CM

## 2024-12-15 DIAGNOSIS — R319 Hematuria, unspecified: Secondary | ICD-10-CM

## 2024-12-15 DIAGNOSIS — R7303 Prediabetes: Secondary | ICD-10-CM

## 2024-12-15 DIAGNOSIS — N950 Postmenopausal bleeding: Secondary | ICD-10-CM

## 2024-12-15 DIAGNOSIS — I10 Essential (primary) hypertension: Secondary | ICD-10-CM | POA: Diagnosis not present

## 2024-12-15 DIAGNOSIS — E782 Mixed hyperlipidemia: Secondary | ICD-10-CM

## 2024-12-15 LAB — POCT URINALYSIS DIPSTICK
Bilirubin, UA: NEGATIVE
Glucose, UA: NEGATIVE
Ketones, UA: NEGATIVE
Leukocytes, UA: NEGATIVE
Nitrite, UA: NEGATIVE
Protein, UA: NEGATIVE
Spec Grav, UA: 1.02
Urobilinogen, UA: 0.2 U/dL
pH, UA: 6

## 2024-12-15 NOTE — Progress Notes (Signed)
 "  Established Patient Office Visit  Subjective:  Patient ID: Cassandra Miller, female    DOB: 09/02/1951  Age: 74 y.o. MRN: [id number]  No chief complaint on file.   Patient comes in for her follow-up today but is concerned about noticing blood stained vaginal discharge.  It started about 3 days ago and now has more bright red spotting.  In 2023 patient had a pelvic ultrasound for pelvic pain,  which showed thickened endometrium.  She underwent biopsy by her OB/GYN.  Which was unremarkable and found to have a cervical polyp.  No treatments were initiated at that time as patient was not bleeding.  However with this new onset bleeding we will refer her to her OB/GYN again and schedule a pelvic ultrasound. Her blood pressure is elevated today as she is worried.  Will continue to monitor. Denies headache or dizziness, no chest pain, no palpitations and no nausea or vomiting. Occasionally feels pain in her back mostly right or left flank. Mammogram is already scheduled for this year. Check labs today.  Monitor blood pressure.    No other concerns at this time.   Past Medical History:  Diagnosis Date   Hyperlipemia    Hypertension    Skin cancer     Past Surgical History:  Procedure Laterality Date   CESAREAN SECTION     COLONOSCOPY WITH PROPOFOL  N/A 11/17/2019   Procedure: COLONOSCOPY WITH PROPOFOL ;  Surgeon: Jinny Carmine, MD;  Location: [facility] ENDOSCOPY;  Service: Endoscopy;  Laterality: N/A;   LYMPH NODE DISSECTION     SKIN CANCER EXCISION      Social History   Socioeconomic History   Marital status: Married    Spouse name: Not on file   Number of children: Not on file   Years of education: Not on file   Highest education level: Not on file  Occupational History   Not on file  Tobacco Use   Smoking status: Never   Smokeless tobacco: Never  Vaping Use   Vaping status: Never Used  Substance and Sexual Activity   Alcohol use: Yes    Comment: social   Drug use: No   Sexual  activity: Not Currently    Birth control/protection: Post-menopausal  Other Topics Concern   Not on file  Social History Narrative   Not on file   Social Drivers of Health   Tobacco Use: Low Risk (12/15/2024)   Patient History    Smoking Tobacco Use: Never    Smokeless Tobacco Use: Never    Passive Exposure: Not on file  Financial Resource Strain: Not on file  Food Insecurity: Not on file  Transportation Needs: Not on file  Physical Activity: Not on file  Stress: Not on file  Social Connections: Not on file  Intimate Partner Violence: Not on file  Depression (PHQ2-9): Medium Risk (08/31/2024)   Depression (PHQ2-9)    PHQ-2 Score: 5  Alcohol Screen: Not on file  Housing: Not on file  Utilities: Not on file  Health Literacy: Not on file    Family History  Problem Relation Age of Onset   Breast cancer Paternal Aunt        52's   Breast cancer Paternal Grandmother        58's    Allergies[1]  Show/hide medication list[2]  Review of Systems  Constitutional:  Positive for malaise/fatigue. Negative for chills and fever.  HENT: Negative.  Negative for congestion and sore throat.   Eyes: Negative.  Negative for blurred  vision and pain.  Respiratory: Negative.  Negative for cough and shortness of breath.   Cardiovascular: Negative.  Negative for chest pain, palpitations and leg swelling.  Gastrointestinal: Negative.  Negative for abdominal pain, blood in stool, constipation, diarrhea, heartburn, melena, nausea and vomiting.  Genitourinary: Negative.  Negative for dysuria, flank pain, frequency and urgency.  Musculoskeletal: Negative.  Negative for joint pain and myalgias.  Skin: Negative.   Neurological: Negative.  Negative for dizziness, tingling, sensory change, weakness and headaches.  Endo/Heme/Allergies: Negative.   Psychiatric/Behavioral: Negative.  Negative for depression and suicidal ideas. The patient is not nervous/anxious.        Objective:   BP (!) 160/90    Pulse 65   Ht 5' 5 (1.651 m)   Wt 201 lb 3.2 oz (91.3 kg)   BMI 33.48 kg/m   Vitals:   12/15/24 1257  BP: (!) 160/90  Pulse: 65  Height: 5' 5 (1.651 m)  Weight: 201 lb 3.2 oz (91.3 kg)  BMI (Calculated): 33.48    Physical Exam Vitals and nursing note reviewed.  Constitutional:      Appearance: Normal appearance.  HENT:     Head: Normocephalic and atraumatic.     Nose: Nose normal.     Mouth/Throat:     Mouth: Mucous membranes are moist.     Pharynx: Oropharynx is clear.  Eyes:     Conjunctiva/sclera: Conjunctivae normal.     Pupils: Pupils are equal, round, and reactive to light.  Cardiovascular:     Rate and Rhythm: Normal rate and regular rhythm.     Pulses: Normal pulses.     Heart sounds: Normal heart sounds. No murmur heard. Pulmonary:     Effort: Pulmonary effort is normal.     Breath sounds: Normal breath sounds. No wheezing.  Abdominal:     General: Bowel sounds are normal.     Palpations: Abdomen is soft.     Tenderness: There is no abdominal tenderness. There is no right CVA tenderness or left CVA tenderness.  Musculoskeletal:        General: Normal range of motion.     Cervical back: Normal range of motion.     Right lower leg: No edema.     Left lower leg: No edema.  Skin:    General: Skin is warm and dry.  Neurological:     General: No focal deficit present.     Mental Status: She is alert and oriented to person, place, and time.  Psychiatric:        Mood and Affect: Mood normal.        Behavior: Behavior normal.      Results for orders placed or performed in visit on 12/15/24  POCT Urinalysis Dipstick (81002)  Result Value Ref Range   Color, UA     Clarity, UA     Glucose, UA Negative Negative   Bilirubin, UA Negative    Ketones, UA Negative    Spec Grav, UA 1.020 1.010 - 1.025   Blood, UA 2+    pH, UA 6.0 5.0 - 8.0   Protein, UA Negative Negative   Urobilinogen, UA 0.2 0.2 or 1.0 E.U./dL   Nitrite, UA Negative    Leukocytes, UA  Negative Negative   Appearance     Odor      Recent Results (from the past 2160 hours)  POCT Urinalysis Dipstick (18997)     Status: Abnormal   Collection Time: 12/15/24 11:28 AM  Result Value Ref  Range   Color, UA     Clarity, UA     Glucose, UA Negative Negative   Bilirubin, UA Negative    Ketones, UA Negative    Spec Grav, UA 1.020 1.010 - 1.025   Blood, UA 2+    pH, UA 6.0 5.0 - 8.0   Protein, UA Negative Negative   Urobilinogen, UA 0.2 0.2 or 1.0 E.U./dL   Nitrite, UA Negative    Leukocytes, UA Negative Negative   Appearance     Odor        Assessment & Plan:  Pelvic ultrasound and OB/GYN referral for postmenopausal bleeding.  Check blood work today.  Monitor blood pressure.  Will adjust blood pressure medication at follow-up if still high. Problem List Items Addressed This Visit       Cardiovascular and Mediastinum   Essential hypertension, benign - Primary   Relevant Orders   CBC with Diff   CMP14+EGFR   POCT Urinalysis Dipstick (18997) (Completed)     Other   Prediabetes   Relevant Orders   Hemoglobin A1c   Mixed hyperlipidemia   Relevant Orders   Lipid Panel w/o Chol/HDL Ratio   Other Visit Diagnoses       Breast cancer screening by mammogram         Post-menopausal bleeding       Relevant Orders   US  Pelvic Complete With Transvaginal   Ambulatory referral to Obstetrics / Gynecology       Return in about 3 weeks (around 01/05/2025).   Total time spent: 30 minutes. This time includes review of previous notes and results and patient face to face interaction during today's visit.    FERNAND FREDY RAMAN, MD  12/15/2024   This document may have been prepared by [facility] Voice Recognition software and as such may include unintentional dictation errors.     [1]  Allergies Allergen Reactions   Iodinated Contrast Media     Rash  [2]  Outpatient Medications Prior to Visit  Medication Sig   furosemide  (LASIX ) 20 MG tablet Take 1 tab three times a  week   hydrALAZINE  (APRESOLINE ) 50 MG tablet Take 1 tablet (50 mg total) by mouth 2 (two) times daily.   Multiple Vitamin (MULTIVITAMIN) capsule Take 1 capsule by mouth daily.   olmesartan  (BENICAR ) 40 MG tablet Take 1 tablet (40 mg total) by mouth daily.   simvastatin  (ZOCOR ) 20 MG tablet Take 1 tablet (20 mg total) by mouth daily.   spironolactone  (ALDACTONE ) 25 MG tablet Take 1 tablet (25 mg total) by mouth daily.   No facility-administered medications prior to visit.   "

## 2024-12-16 LAB — CMP14+EGFR
ALT: 47 IU/L — ABNORMAL HIGH (ref 0–32)
AST: 47 IU/L — ABNORMAL HIGH (ref 0–40)
Albumin: 4.5 g/dL (ref 3.8–4.8)
Alkaline Phosphatase: 88 IU/L (ref 49–135)
BUN/Creatinine Ratio: 13 (ref 12–28)
BUN: 13 mg/dL (ref 8–27)
Bilirubin Total: 0.5 mg/dL (ref 0.0–1.2)
CO2: 23 mmol/L (ref 20–29)
Calcium: 10.5 mg/dL — ABNORMAL HIGH (ref 8.7–10.3)
Chloride: 108 mmol/L — ABNORMAL HIGH (ref 96–106)
Creatinine, Ser: 1.01 mg/dL — ABNORMAL HIGH (ref 0.57–1.00)
Globulin, Total: 2.4 g/dL (ref 1.5–4.5)
Glucose: 92 mg/dL (ref 70–99)
Potassium: 4.8 mmol/L (ref 3.5–5.2)
Sodium: 144 mmol/L (ref 134–144)
Total Protein: 6.9 g/dL (ref 6.0–8.5)
eGFR: 58 mL/min/1.73 — ABNORMAL LOW

## 2024-12-16 LAB — LIPID PANEL W/O CHOL/HDL RATIO
Cholesterol, Total: 208 mg/dL — ABNORMAL HIGH (ref 100–199)
HDL: 64 mg/dL
LDL Chol Calc (NIH): 121 mg/dL — ABNORMAL HIGH (ref 0–99)
Triglycerides: 129 mg/dL (ref 0–149)
VLDL Cholesterol Cal: 23 mg/dL (ref 5–40)

## 2024-12-16 LAB — CBC WITH DIFFERENTIAL/PLATELET
Basophils Absolute: 0.1 x10E3/uL (ref 0.0–0.2)
Basos: 1 %
EOS (ABSOLUTE): 0.1 x10E3/uL (ref 0.0–0.4)
Eos: 1 %
Hematocrit: 47 % — ABNORMAL HIGH (ref 34.0–46.6)
Hemoglobin: 15.5 g/dL (ref 11.1–15.9)
Immature Grans (Abs): 0.1 x10E3/uL (ref 0.0–0.1)
Immature Granulocytes: 1 %
Lymphocytes Absolute: 1.7 x10E3/uL (ref 0.7–3.1)
Lymphs: 23 %
MCH: 32.2 pg (ref 26.6–33.0)
MCHC: 33 g/dL (ref 31.5–35.7)
MCV: 98 fL — ABNORMAL HIGH (ref 79–97)
Monocytes Absolute: 1 x10E3/uL — ABNORMAL HIGH (ref 0.1–0.9)
Monocytes: 13 %
Neutrophils Absolute: 4.8 x10E3/uL (ref 1.4–7.0)
Neutrophils: 61 %
Platelets: 291 x10E3/uL (ref 150–450)
RBC: 4.81 x10E6/uL (ref 3.77–5.28)
RDW: 12.4 % (ref 11.7–15.4)
WBC: 7.7 x10E3/uL (ref 3.4–10.8)

## 2024-12-16 LAB — HEMOGLOBIN A1C
Est. average glucose Bld gHb Est-mCnc: 123 mg/dL
Hgb A1c MFr Bld: 5.9 % — ABNORMAL HIGH (ref 4.8–5.6)

## 2024-12-22 ENCOUNTER — Ambulatory Visit
Admission: RE | Admit: 2024-12-22 | Discharge: 2024-12-22 | Disposition: A | Discharge status: Home / Self Care | Source: Ambulatory Visit | Attending: Internal Medicine | Admitting: Internal Medicine

## 2024-12-22 DIAGNOSIS — N950 Postmenopausal bleeding: Secondary | ICD-10-CM | POA: Diagnosis present

## 2024-12-24 ENCOUNTER — Other Ambulatory Visit: Payer: Self-pay | Admitting: Medical Genetics

## 2024-12-26 ENCOUNTER — Other Ambulatory Visit
Admission: RE | Admit: 2024-12-26 | Discharge: 2024-12-26 | Disposition: A | Discharge status: Home / Self Care | Payer: Self-pay | Source: Ambulatory Visit | Attending: Medical Genetics | Admitting: Medical Genetics

## 2024-12-27 NOTE — Progress Notes (Signed)
 Patient notified

## 2024-12-29 ENCOUNTER — Ambulatory Visit
Admission: RE | Admit: 2024-12-29 | Discharge: 2024-12-29 | Disposition: A | Source: Ambulatory Visit | Attending: Internal Medicine | Admitting: Internal Medicine

## 2024-12-29 DIAGNOSIS — Z1231 Encounter for screening mammogram for malignant neoplasm of breast: Secondary | ICD-10-CM | POA: Insufficient documentation

## 2025-01-02 ENCOUNTER — Ambulatory Visit: Payer: Self-pay | Admitting: Internal Medicine

## 2025-01-05 ENCOUNTER — Encounter: Payer: Self-pay | Admitting: Internal Medicine

## 2025-01-05 ENCOUNTER — Ambulatory Visit: Admitting: Internal Medicine

## 2025-01-05 VITALS — BP 126/84 | HR 48 | Ht 65.0 in | Wt 200.8 lb

## 2025-01-05 DIAGNOSIS — E782 Mixed hyperlipidemia: Secondary | ICD-10-CM

## 2025-01-05 DIAGNOSIS — N950 Postmenopausal bleeding: Secondary | ICD-10-CM | POA: Diagnosis not present

## 2025-01-05 DIAGNOSIS — R748 Abnormal levels of other serum enzymes: Secondary | ICD-10-CM | POA: Diagnosis not present

## 2025-01-05 DIAGNOSIS — I10 Essential (primary) hypertension: Secondary | ICD-10-CM | POA: Diagnosis not present

## 2025-01-05 LAB — GENECONNECT MOLECULAR SCREEN: Genetic Analysis Overall Interpretation: NEGATIVE

## 2025-01-05 NOTE — Progress Notes (Signed)
 "  Established Patient Office Visit  Subjective:  Patient ID: Cassandra Miller, female    DOB: 08-11-1951  Age: 74 y.o. MRN: [id number]  Chief Complaint  Patient presents with   Follow-up    3 week follow up    Patient comes in for follow-up today.  Her blood pressure is looking better.  She had labs done to be discussed today.  Her LDL was slightly elevated on simvastatin  20 mg.  Advised strict diet control.  She also has elevated alkaline phosphatase, suspect fatty liver but will order an abdominal ultrasound. Patient had recently presented with postmenopausal bleeding, but she has not seen any further blood.  Her pelvic ultrasound showed a thickened endometrial lining and she already has made an appointment with OB/GYN for possible biopsy.    No other concerns at this time.   Past Medical History:  Diagnosis Date   Hyperlipemia    Hypertension    Skin cancer     Past Surgical History:  Procedure Laterality Date   CESAREAN SECTION     COLONOSCOPY WITH PROPOFOL  N/A 11/17/2019   Procedure: COLONOSCOPY WITH PROPOFOL ;  Surgeon: Jinny Carmine, MD;  Location: [facility] ENDOSCOPY;  Service: Endoscopy;  Laterality: N/A;   LYMPH NODE DISSECTION     SKIN CANCER EXCISION      Social History   Socioeconomic History   Marital status: Married    Spouse name: Not on file   Number of children: Not on file   Years of education: Not on file   Highest education level: Not on file  Occupational History   Not on file  Tobacco Use   Smoking status: Never   Smokeless tobacco: Never  Vaping Use   Vaping status: Never Used  Substance and Sexual Activity   Alcohol use: Yes    Comment: social   Drug use: No   Sexual activity: Not Currently    Birth control/protection: Post-menopausal  Other Topics Concern   Not on file  Social History Narrative   Not on file   Social Drivers of Health   Tobacco Use: Low Risk (01/05/2025)   Patient History    Smoking Tobacco Use: Never    Smokeless Tobacco  Use: Never    Passive Exposure: Not on file  Financial Resource Strain: Not on file  Food Insecurity: Not on file  Transportation Needs: Not on file  Physical Activity: Not on file  Stress: Not on file  Social Connections: Not on file  Intimate Partner Violence: Not on file  Depression (PHQ2-9): Medium Risk (08/31/2024)   Depression (PHQ2-9)    PHQ-2 Score: 5  Alcohol Screen: Not on file  Housing: Not on file  Utilities: Not on file  Health Literacy: Not on file    Family History  Problem Relation Age of Onset   Breast cancer Paternal Aunt        14's   Breast cancer Paternal Grandmother        44's    Allergies[1]  Show/hide medication list[2]  Review of Systems  Constitutional: Negative.  Negative for chills, fever and malaise/fatigue.  HENT: Negative.  Negative for congestion and sore throat.   Eyes: Negative.  Negative for blurred vision and pain.  Respiratory: Negative.  Negative for cough and shortness of breath.   Cardiovascular: Negative.  Negative for chest pain, palpitations and leg swelling.  Gastrointestinal: Negative.  Negative for abdominal pain, blood in stool, constipation, diarrhea, heartburn, melena, nausea and vomiting.  Genitourinary: Negative.  Negative for  dysuria, flank pain, frequency and urgency.  Musculoskeletal: Negative.  Negative for joint pain and myalgias.  Skin: Negative.   Neurological: Negative.  Negative for dizziness, tingling, sensory change, weakness and headaches.  Endo/Heme/Allergies: Negative.   Psychiatric/Behavioral: Negative.  Negative for depression and suicidal ideas. The patient is not nervous/anxious.        Objective:   BP 126/84   Pulse (!) 48   Ht 5' 5 (1.651 m)   Wt 200 lb 12.8 oz (91.1 kg)   SpO2 98%   BMI 33.41 kg/m   Vitals:   01/05/25 1121  BP: 126/84  Pulse: (!) 48  Height: 5' 5 (1.651 m)  Weight: 200 lb 12.8 oz (91.1 kg)  SpO2: 98%  BMI (Calculated): 33.41    Physical Exam Vitals and nursing  note reviewed.  Constitutional:      Appearance: Normal appearance.  HENT:     Head: Normocephalic and atraumatic.     Nose: Nose normal.     Mouth/Throat:     Mouth: Mucous membranes are moist.     Pharynx: Oropharynx is clear.  Eyes:     Conjunctiva/sclera: Conjunctivae normal.     Pupils: Pupils are equal, round, and reactive to light.  Cardiovascular:     Rate and Rhythm: Normal rate and regular rhythm.     Pulses: Normal pulses.     Heart sounds: Normal heart sounds. No murmur heard. Pulmonary:     Effort: Pulmonary effort is normal.     Breath sounds: Normal breath sounds. No wheezing.  Abdominal:     General: Bowel sounds are normal.     Palpations: Abdomen is soft.     Tenderness: There is no abdominal tenderness. There is no right CVA tenderness or left CVA tenderness.  Musculoskeletal:        General: Normal range of motion.     Cervical back: Normal range of motion.     Right lower leg: No edema.     Left lower leg: No edema.  Skin:    General: Skin is warm and dry.  Neurological:     General: No focal deficit present.     Mental Status: She is alert and oriented to person, place, and time.  Psychiatric:        Mood and Affect: Mood normal.        Behavior: Behavior normal.      No results found for any visits on 01/05/25.  Recent Results (from the past 2160 hours)  Hemoglobin A1c     Status: Abnormal   Collection Time: 12/15/24 10:55 AM  Result Value Ref Range   Hgb A1c MFr Bld 5.9 (H) 4.8 - 5.6 %    Comment:          Prediabetes: 5.7 - 6.4          Diabetes: >6.4          Glycemic control for adults with diabetes: <7.0    Est. average glucose Bld gHb Est-mCnc 123 mg/dL  CBC with Diff     Status: Abnormal   Collection Time: 12/15/24 10:55 AM  Result Value Ref Range   WBC 7.7 3.4 - 10.8 x10E3/uL   RBC 4.81 3.77 - 5.28 x10E6/uL   Hemoglobin 15.5 11.1 - 15.9 g/dL   Hematocrit 52.9 (H) 65.9 - 46.6 %   MCV 98 (H) 79 - 97 fL   MCH 32.2 26.6 - 33.0 pg    MCHC 33.0 31.5 - 35.7 g/dL  RDW 12.4 11.7 - 15.4 %   Platelets 291 150 - 450 x10E3/uL   Neutrophils 61 Not Estab. %   Lymphs 23 Not Estab. %   Monocytes 13 Not Estab. %   Eos 1 Not Estab. %   Basos 1 Not Estab. %   Neutrophils Absolute 4.8 1.4 - 7.0 x10E3/uL   Lymphocytes Absolute 1.7 0.7 - 3.1 x10E3/uL   Monocytes Absolute 1.0 (H) 0.1 - 0.9 x10E3/uL   EOS (ABSOLUTE) 0.1 0.0 - 0.4 x10E3/uL   Basophils Absolute 0.1 0.0 - 0.2 x10E3/uL   Immature Granulocytes 1 Not Estab. %   Immature Grans (Abs) 0.1 0.0 - 0.1 x10E3/uL  Lipid Panel w/o Chol/HDL Ratio     Status: Abnormal   Collection Time: 12/15/24 10:55 AM  Result Value Ref Range   Cholesterol, Total 208 (H) 100 - 199 mg/dL   Triglycerides 870 0 - 149 mg/dL   HDL 64 >60 mg/dL   VLDL Cholesterol Cal 23 5 - 40 mg/dL   LDL Chol Calc (NIH) 878 (H) 0 - 99 mg/dL  RFE85+ZHQM     Status: Abnormal   Collection Time: 12/15/24 10:55 AM  Result Value Ref Range   Glucose 92 70 - 99 mg/dL   BUN 13 8 - 27 mg/dL   Creatinine, Ser 8.98 (H) 0.57 - 1.00 mg/dL   eGFR 58 (L) >40 fO/fpw/8.26   BUN/Creatinine Ratio 13 12 - 28   Sodium 144 134 - 144 mmol/L   Potassium 4.8 3.5 - 5.2 mmol/L   Chloride 108 (H) 96 - 106 mmol/L   CO2 23 20 - 29 mmol/L   Calcium 10.5 (H) 8.7 - 10.3 mg/dL   Total Protein 6.9 6.0 - 8.5 g/dL   Albumin 4.5 3.8 - 4.8 g/dL   Globulin, Total 2.4 1.5 - 4.5 g/dL   Bilirubin Total 0.5 0.0 - 1.2 mg/dL   Alkaline Phosphatase 88 49 - 135 IU/L   AST 47 (H) 0 - 40 IU/L   ALT 47 (H) 0 - 32 IU/L  POCT Urinalysis Dipstick (18997)     Status: Abnormal   Collection Time: 12/15/24 11:28 AM  Result Value Ref Range   Color, UA     Clarity, UA     Glucose, UA Negative Negative   Bilirubin, UA Negative    Ketones, UA Negative    Spec Grav, UA 1.020 1.010 - 1.025   Blood, UA 2+    pH, UA 6.0 5.0 - 8.0   Protein, UA Negative Negative   Urobilinogen, UA 0.2 0.2 or 1.0 E.U./dL   Nitrite, UA Negative    Leukocytes, UA Negative  Negative   Appearance     Odor    [facility] Molecular Screen - Blood ([facility])     Status: None   Collection Time: 12/26/24  1:31 PM  Result Value Ref Range   Genetic Analysis Overall Interpretation Negative    Genetic Disease Assessed      This is a screening test and does not detect all pathogenic or likely pathogenic variant(s) in the tested genes; diagnostic testing is recommended for individuals with a personal or family history of heart disease or hereditary cancer. [facility] Tier One  Population Screen is a screening test that analyzes 11 genes related to hereditary breast and ovarian cancer (HBOC) syndrome, Lynch syndrome, and familial hypercholesterolemia. This test only reports clinically significant pathogenic and likely  pathogenic variants but does not report variants of uncertain significance (VUS). In addition, analysis of the  PMS2 gene excludes exons 11-15, which overlap with a known pseudogene (PMS2CL).    Genetic Analysis Report      No pathogenic or likely pathogenic variants were detected in the genes analyzed by this test.Genetic test results should be interpreted in the context of an individual's personal medical and family history. Alteration to medical management is NOT  recommended based solely on this result. Clinical correlation is advised.Additional Considerations- This is a screening test; individuals may still carry pathogenic or likely pathogenic variant(s) in the tested genes that are not detected by this test.-  For individuals at risk for these or other related conditions based on factors including personal or family history, diagnostic testing is recommended.- The absence of pathogenic or likely pathogenic variant(s) in the analyzed genes, while reassuring,  does not eliminate the possibility of a hereditary condition; there are other variants and genes associated with heart disease and hereditary cancer that are not included in this test.    Genes  Tested See Notes     Comment: APOB, BRCA1, BRCA2, EPCAM, LDLR, LDLRAP1, PCSK9, PMS2, MLH1, MSH2, MSH6   Disclaimer See Notes     Comment: This test was developed and validated by [facility] This test has not been cleared or approved by the [place]  Food and Drug Administration (FDA). The [facility] laboratory is accredited by the College of American Pathologists (CAP) and certified under  the Clinical Laboratory Improvement Amendments (CLIA #: [id number]) to perform high-complexity clinical tests. This test is used for clinical purposes. It should not be regarded as investigational use only or for research use only.    Sequencing Location See Notes     Comment: Sequencing done at [facility] [address], [address], [place] [address] (CLIA# [id number])   Interpretation Methods and Limitations See Notes     Comment: Extracted DNA is enriched for targeted regions and then sequenced using the [facility] Exome+ (R) assay on an Illumina DNA sequencing system. Data is then aligned to a modified version of GRCh38 and all genes are analyzed using the MANE transcript and MANE  Plus Clinical transcript, when available. Small variant calling is completed using a customized version of [employer]'s DNAseq software, augmented by a proprietary small variant caller for difficult variants. Copy number variants (CNVs) are then called  using a proprietary bioinformatics pipeline based on depth analysis with a comparison to similarly sequenced samples. Analysis of the PMS2 gene is limited to exons 1-10. Both the MSH2 Boland inversion (exons 1-7) and the BRCA2 Alu insertion are detected  by identifying discordant read-pairs spanning the breakpoints. The interpretation and reporting of variants in APOB, PCSK9, and LDLR is specific to familial hypercholesterolemia; variants associated with hypobetalipoproteinemia are not i ncluded.  Interpretation is based upon guidelines published by the [employer] of  [employer]), the Association for Molecular Pathology (AMP) or their modification by [employer] when available and/or review  of previous clinical assertions available in the [employer]. Interpretation is limited to the transcripts indicated on the report and +/- 10 bp into intronic regions, except as noted below. [facility] variant classifications include pathogenic, likely  pathogenic, variant of uncertain significance (VUS), likely benign, and benign. Only variants classified as pathogenic and likely pathogenic are included in the report. All reported variants are confirmed through secondary manual inspection of DNA  sequence data or orthogonal testing. Risk estimations and management guidelines included in this report are based on analysis of primary literature and  recommendations of applicable professional societies, and should be regarded  as approximations.Based  on validation studies, this assay delivers > 99% sensitivity and specificity for single nucleotide variants and insertions and deletions (indels) up to 20 bp. Larger indels and complex variants are also reported but sensitivity may be reduced. Based on  validation studies, this assay delivers > 99% sensitivity to multi-exon CNVs and > 90% sensitivity to single-exon CNVs. This test may not detect variants in challenging regions (such as short tandem repeats, homopolymer runs, and segment duplications),  sub-exonic CNVs, chromosomal aneuploidy, or variants in the presence of mosaicism. Phasing will be attempted and reported, when possible. Structural rearrangements such as inversions, translocations, complex rearrangements, and gene conversions are not  tested in this assay unless explicitly indicated. Additionally, deep intronic, promoter, and enhancer regions may not be covered. It is important to note that this is a screening test and cannot detect all di sease-causing variants. A negative  result does  not guarantee the absence of a rare, undetectable variant in the genes analyzed; consider using a diagnostic test if there is significant personal and/or family history of one of the conditions analyzed by this test. Any potential incidental findings  outside of these genes and conditions will not be identified, nor reported. The results of a genetic test may be influenced by various factors, including bone marrow transplantation, blood transfusions, or in rare cases, hematolymphoid neoplasms.Gene  Specific Notes:APOB: analysis is limited to c.10580G>A and c.10579C>T; BRCA1: sequencing analysis extends to CDS +/-20 bp; BRCA2: analysis includes detection of [id number] and sequencing analysis extends to CDS +/-20 bp. EPCAM: analysis is limited  to CNV of exons 8-9; LDLR: analysis includes CNV of the promoter; MLH1: analysis includes CNV of the promoter; MSH2: analysis includes detection of the Boland inversion (inversion of exons 1 -7) and detection of c.942+3A>T, PMS2: analysis is limited to  exons 1-10.Donnice JINNY Kemp, PhD, [email or web address]       Assessment & Plan:  To continue current medications.  Patient advised to keep her appointment with OB/GYN.  Also to schedule liver ultrasound to evaluate for  elevated liver enzymes.  Problem List Items Addressed This Visit       Cardiovascular and Mediastinum   Essential hypertension, benign - Primary     Other   Mixed hyperlipidemia   Other Visit Diagnoses       Elevated liver enzymes       Relevant Orders   US  Abdomen Limited RUQ (LIVER/GB)     Post-menopausal bleeding           Return in about 3 months (around 04/05/2025).   Total time spent: 30 minutes. This time includes review of previous notes and results and patient face to face interaction during today's visit.    FERNAND FREDY RAMAN, MD  01/05/2025   This document may have been prepared by [facility] Voice Recognition software and as such may include  unintentional dictation errors.     [1]  Allergies Allergen Reactions   Iodinated Contrast Media     Rash  [2]  Outpatient Medications Prior to Visit  Medication Sig   furosemide  (LASIX ) 20 MG tablet Take 1 tab three times a week   hydrALAZINE  (APRESOLINE ) 50 MG tablet Take 1 tablet (50 mg total) by mouth 2 (two) times daily.   Multiple Vitamin (MULTIVITAMIN) capsule Take 1 capsule by mouth daily.   olmesartan  (BENICAR ) 40 MG tablet Take 1 tablet (40 mg total) by mouth daily.   simvastatin  (ZOCOR ) 20 MG tablet  Take 1 tablet (20 mg total) by mouth daily.   spironolactone  (ALDACTONE ) 25 MG tablet Take 1 tablet (25 mg total) by mouth daily.   No facility-administered medications prior to visit.   "

## 2025-01-12 ENCOUNTER — Ambulatory Visit: Admission: RE | Admit: 2025-01-12

## 2025-01-12 DIAGNOSIS — R748 Abnormal levels of other serum enzymes: Secondary | ICD-10-CM

## 2025-01-26 ENCOUNTER — Encounter: Admitting: Certified Nurse Midwife

## 2025-02-23 ENCOUNTER — Ambulatory Visit: Admitting: Cardiovascular Disease

## 2025-04-05 ENCOUNTER — Ambulatory Visit: Admitting: Internal Medicine
